# Patient Record
Sex: Female | Born: 1945 | Race: White | Hispanic: No | Marital: Married | Smoking: Former smoker
Health system: Southern US, Community
[De-identification: ages and names within clinical notes are randomized; demographics above are authoritative.]

## PROBLEM LIST (undated history)

## (undated) DIAGNOSIS — M810 Age-related osteoporosis without current pathological fracture: Secondary | ICD-10-CM

## (undated) DIAGNOSIS — I499 Cardiac arrhythmia, unspecified: Secondary | ICD-10-CM

## (undated) DIAGNOSIS — C801 Malignant (primary) neoplasm, unspecified: Secondary | ICD-10-CM

## (undated) DIAGNOSIS — H269 Unspecified cataract: Secondary | ICD-10-CM

## (undated) DIAGNOSIS — H409 Unspecified glaucoma: Secondary | ICD-10-CM

## (undated) DIAGNOSIS — T7840XA Allergy, unspecified, initial encounter: Secondary | ICD-10-CM

## (undated) HISTORY — DX: Cardiac arrhythmia, unspecified: I49.9

## (undated) HISTORY — DX: Malignant (primary) neoplasm, unspecified: C80.1

## (undated) HISTORY — DX: Age-related osteoporosis without current pathological fracture: M81.0

## (undated) HISTORY — DX: Unspecified cataract: H26.9

## (undated) HISTORY — DX: Allergy, unspecified, initial encounter: T78.40XA

## (undated) HISTORY — DX: Unspecified glaucoma: H40.9

---
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
## 1973-12-03 HISTORY — PX: DILATION AND CURETTAGE OF UTERUS: SHX78

## 1973-12-03 HISTORY — PX: APPENDECTOMY: SHX54

## 2005-02-09 ENCOUNTER — Ambulatory Visit: Payer: Self-pay | Admitting: Rheumatology

## 2005-02-22 ENCOUNTER — Ambulatory Visit: Payer: Self-pay | Admitting: Unknown Physician Specialty

## 2005-07-13 LAB — HM COLONOSCOPY: HM COLON: NORMAL

## 2007-12-04 DIAGNOSIS — C801 Malignant (primary) neoplasm, unspecified: Secondary | ICD-10-CM

## 2007-12-04 HISTORY — DX: Malignant (primary) neoplasm, unspecified: C80.1

## 2008-01-13 ENCOUNTER — Ambulatory Visit: Payer: Self-pay | Admitting: Family Medicine

## 2009-05-18 ENCOUNTER — Ambulatory Visit: Payer: Self-pay | Admitting: Family Medicine

## 2010-01-19 ENCOUNTER — Ambulatory Visit: Payer: Self-pay | Admitting: Family Medicine

## 2010-02-15 ENCOUNTER — Ambulatory Visit: Payer: Self-pay | Admitting: Family Medicine

## 2012-04-21 ENCOUNTER — Other Ambulatory Visit: Payer: Self-pay | Admitting: Family Medicine

## 2012-04-21 LAB — CBC WITH DIFFERENTIAL/PLATELET
Basophil #: 0 10*3/uL (ref 0.0–0.1)
Basophil %: 0.7 %
Eosinophil #: 0.3 10*3/uL (ref 0.0–0.7)
HCT: 41.4 % (ref 35.0–47.0)
MCH: 32.2 pg (ref 26.0–34.0)
MCHC: 34.2 g/dL (ref 32.0–36.0)
MCV: 94 fL (ref 80–100)
Monocyte %: 12.4 %
Neutrophil %: 33.9 %
Platelet: 187 10*3/uL (ref 150–440)
RBC: 4.39 10*6/uL (ref 3.80–5.20)
RDW: 12.8 % (ref 11.5–14.5)
WBC: 4.2 10*3/uL (ref 3.6–11.0)

## 2012-05-06 ENCOUNTER — Ambulatory Visit: Payer: Self-pay | Admitting: Family Medicine

## 2012-12-16 ENCOUNTER — Encounter: Payer: Self-pay | Admitting: Internal Medicine

## 2012-12-16 ENCOUNTER — Ambulatory Visit (INDEPENDENT_AMBULATORY_CARE_PROVIDER_SITE_OTHER): Payer: Medicare Other | Admitting: Internal Medicine

## 2012-12-16 VITALS — BP 108/78 | HR 73 | Temp 98.0°F | Resp 16 | Ht 61.25 in | Wt 101.5 lb

## 2012-12-16 DIAGNOSIS — F5104 Psychophysiologic insomnia: Secondary | ICD-10-CM | POA: Insufficient documentation

## 2012-12-16 DIAGNOSIS — G4700 Insomnia, unspecified: Secondary | ICD-10-CM

## 2012-12-16 DIAGNOSIS — E559 Vitamin D deficiency, unspecified: Secondary | ICD-10-CM

## 2012-12-16 DIAGNOSIS — M810 Age-related osteoporosis without current pathological fracture: Secondary | ICD-10-CM

## 2012-12-16 NOTE — Assessment & Plan Note (Signed)
She prefers to handle nonpharmacologically .proper sleep habit discussed.

## 2012-12-16 NOTE — Progress Notes (Addendum)
Patient ID: Nicole Nixon, female   DOB: 1946/03/18, 67 y.o.   MRN: [id number]  There is no problem list on file for this patient.   Subjective:  CC:   Chief Complaint  Patient presents with  . Establish Care    HPI:   Nicole Nixon is a 67 y.o. female who presents as a new patient to establish primary care with the chief complaint oTransferring from Dr. Burnett Sheng.  From [place].  Last PAP smear was May 2013.  no prior abnormals.  Last mammogram [place] in May . Employee of [employer]   Lots of computer work.  Has glaucoma managed by Dr. Alexia Freestone .  Normal colonoscopy age 66. By Renda Rolls.  History of basal call CA skin CA treated by Orson Aloe with cream in 2009, with annual follow up.   Chronic insomnia,  Prefers to manage naturally with relaxation  Averages 6 hours /day .  No hypersomnolence .  Drinks  Coffee in the morning .  Occasional glass of wine. /  Walks daily for exercise and elliptical machine if weather is bad.  No joint pain. 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  Prior treatment with Evista, alendronate.  No change in bone density,  Then Forteo for 11 months did not tolerate the injection due to hypercalcemia. No hotflahses.  History of fibroid tumors.  Resected.  C section done early for placenta previa/abruption.  2 sons live in [place].  5 yr old grandson     Past Medical History  Diagnosis Date  . 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 Glaucoma     Past Surgical History  Procedure Date  . Appendectomy 1975  . Tonsillectomy and adenoidectomy 1958  . Cesarean section 1978  . Dilation and curettage of uterus 1975    Family History  Problem Relation Age of Onset  . Adopted: Yes    History   Social History  . Marital Status: Married    Spouse Name: N/A    Number of Children: N/A  . Years of Education: N/A   Occupational History  . Not on file.   Social History Main Topics  . Smoking status:  Former Smoker    Quit date: 12/16/1981  . Smokeless tobacco: Not on file  . Alcohol Use: No  . Drug Use: No  . Sexually Active: Yes   Other Topics Concern  . Not on file   Social History Narrative  . No narrative on file        Allergies  Allergen Reactions  . Codeine Other (See Comments)    Makes pt very agitated and anxious.   . Erythromycin Other (See Comments)    Sever stomach cramping.    Marland Kitchen Penicillins Rash  . Influenza Virus Vaccine Split     Allergic to eggs,  Needs exemption letter annually      Review of Systems:   The remainder of the review of systems was negative except those addressed in the HPI.       Objective:  BP 108/78  Pulse 73  Temp 98 F (36.7 C) (Oral)  Resp 16  Ht 5' 1.25" (1.556 m)  Wt 101 lb 8 oz (46.04 kg)  BMI 19.02 kg/m2  SpO2 99%  General appearance: alert, cooperative and appears stated age Ears: normal TM's and external ear canals both ears Throat: lips, mucosa, and tongue normal; teeth and gums normal Neck: no adenopathy,  no carotid bruit, supple, symmetrical, trachea midline and thyroid not enlarged, symmetric, no tenderness/mass/nodules Back: symmetric, no curvature. ROM normal. No CVA tenderness. Lungs: clear to auscultation bilaterally Heart: regular rate and rhythm, S1, S2 normal, no murmur, click, rub or gallop Abdomen: soft, non-tender; bowel sounds normal; no masses,  no organomegaly Pulses: 2+ and symmetric Skin: Skin color, texture, turgor normal. No rashes or lesions Lymph nodes: Cervical, supraclavicular, and axillary nodes normal.  Assessment and Plan:  Osteoporosis, post-menopausal By prior DEXA ,with lack of response to prior trials of bisphosphonates , SERMS and PTH.   Vitamin D deficiency She is taking 1000 units daily but has not had her level checked   Chronic insomnia She prefers to handle nonpharmacologically .proper sleep habit discussed.    Updated Medication List Outpatient Encounter  Prescriptions as of 12/16/2012  Medication Sig Dispense Refill  . bimatoprost (LUMIGAN) 0.03 % ophthalmic solution Place 1 drop into both eyes at bedtime.

## 2012-12-16 NOTE — Assessment & Plan Note (Signed)
She is taking 1000 units daily but has not had her level checked

## 2012-12-16 NOTE — Assessment & Plan Note (Signed)
By prior DEXA ,with lack of response to prior trials of bisphosphonates , SERMS and PTH.

## 2012-12-16 NOTE — Patient Instructions (Addendum)
Check with employee health about getting a Pneumonia, a TDaP and Zostavax (shingles vaccine)   We will check a vitamin D level today

## 2013-04-21 ENCOUNTER — Encounter: Payer: Self-pay | Admitting: Internal Medicine

## 2013-04-21 ENCOUNTER — Ambulatory Visit (INDEPENDENT_AMBULATORY_CARE_PROVIDER_SITE_OTHER): Payer: Medicare Other | Admitting: Internal Medicine

## 2013-04-21 VITALS — BP 118/70 | HR 65 | Temp 98.2°F | Resp 16 | Ht 61.0 in | Wt 102.5 lb

## 2013-04-21 DIAGNOSIS — G4700 Insomnia, unspecified: Secondary | ICD-10-CM

## 2013-04-21 DIAGNOSIS — E559 Vitamin D deficiency, unspecified: Secondary | ICD-10-CM

## 2013-04-21 DIAGNOSIS — Z0000 Encounter for general adult medical examination without abnormal findings: Secondary | ICD-10-CM | POA: Insufficient documentation

## 2013-04-21 DIAGNOSIS — Z1239 Encounter for other screening for malignant neoplasm of breast: Secondary | ICD-10-CM

## 2013-04-21 DIAGNOSIS — F5104 Psychophysiologic insomnia: Secondary | ICD-10-CM

## 2013-04-21 DIAGNOSIS — Z23 Encounter for immunization: Secondary | ICD-10-CM

## 2013-04-21 MED ORDER — TETANUS-DIPHTH-ACELL PERTUSSIS 5-2.5-18.5 LF-MCG/0.5 IM SUSP: 0.5000 mL | Freq: Once | INTRAMUSCULAR | Status: DC

## 2013-04-21 MED ORDER — ZOSTER VACCINE LIVE 19400 UNT/0.65ML ~~LOC~~ SOLR: 0.6500 mL | Freq: Once | SUBCUTANEOUS | Status: DC

## 2013-04-21 NOTE — Progress Notes (Signed)
Patient ID: Nicole Nixon, female   DOB: 01-06-1946, 67 y.o.   MRN: [id number]  The patient is here for annual Medicare wellness examination and management of other chronic and acute problems.   The risk factors are reflected in the social history.  The roster of all physicians providing medical care to patient - is listed in the Snapshot section of the chart.  Activities of daily living:  The patient is 100% independent in all ADLs: dressing, toileting, feeding as well as independent mobility  Home safety : The patient has smoke detectors in the home. They wear seatbelts.  There are no firearms at home. There is no violence in the home.   There is no risks for hepatitis, STDs or HIV. There is no   history of blood transfusion. They have no travel history to infectious disease endemic areas of the world.  The patient has seen their dentist in the last six month. They have seen their eye doctor in the last year. They admit to slight hearing difficulty with regard to whispered voices and some television programs.  They have deferred audiologic testing in the last year.  They do not  have excessive sun exposure. Discussed the need for sun protection: hats, long sleeves and use of sunscreen if there is significant sun exposure.   Diet: the importance of a healthy diet is discussed. They do have a healthy diet.  The benefits of regular aerobic exercise were discussed. She walks 4 times per week ,  20 minutes.   Depression screen: there are no signs or vegative symptoms of depression- irritability, change in appetite, anhedonia, sadness/tearfullness.  Cognitive assessment: the patient manages all their financial and personal affairs and is actively engaged. They could relate day,date,year and events; recalled 2/3 objects at 3 minutes; performed clock-face test normally.  The following portions of the patient's history were reviewed and updated as appropriate: allergies, current medications, past  family history, past medical history,  past surgical history, past social history  and problem list.  Visual acuity was not assessed per patient preference since she has regular follow up with her ophthalmologist. Hearing and body mass index were assessed and reviewed.   During the course of the visit the patient was educated and counseled about appropriate screening and preventive services including : fall prevention , diabetes screening, nutrition counseling, colorectal cancer screening, and recommended immunizations.    Objective  General Appearance:    Alert, cooperative, no distress, appears stated age  Head:    Normocephalic, without obvious abnormality, atraumatic  Eyes:    PERRL, conjunctiva/corneas clear, EOM's intact, fundi    benign, both eyes  Ears:    Normal TM's and external ear canals, both ears  Nose:   Nares normal, septum midline, mucosa normal, no drainage    or sinus tenderness  Throat:   Lips, mucosa, and tongue normal; teeth and gums normal  Neck:   Supple, symmetrical, trachea midline, no adenopathy;    thyroid:  no enlargement/tenderness/nodules; no carotid   bruit or JVD  Back:     Symmetric, no curvature, ROM normal, no CVA tenderness  Lungs:     Clear to auscultation bilaterally, respirations unlabored  Chest Wall:    No tenderness or deformity   Heart:    Regular rate and rhythm, S1 and S2 normal, no murmur, rub   or gallop  Breast Exam:    No tenderness, masses, or nipple abnormality  Abdomen:     Soft, non-tender, bowel sounds active  all four quadrants,    no masses, no organomegaly  Genitalia:    Pelvic: cervix normal in appearance, external genitalia normal, no adnexal masses or tenderness, no cervical motion tenderness, rectovaginal septum normal, uterus normal size, shape, and consistency and vagina normal without discharge  Extremities:   Extremities normal, atraumatic, no cyanosis or edema  Pulses:   2+ and symmetric all extremities  Skin:   Skin  color, texture, turgor normal, no rashes or lesions  Lymph nodes:   Cervical, supraclavicular, and axillary nodes normal  Neurologic:   CNII-XII intact, normal strength, sensation and reflexes    throughout   Assessment and Plan  Vitamin D deficiency Normal per January assessment,  continue supplements.   Routine general medical examination at a health care facility Annual comprehensive exam was done including breast, pelvic exam.  . All screenings have been addressed . Recentr [facility] labs show normal GFR, lytes and excellent lipid panel,  HDL 73, LDL 103   Chronic insomnia She wants to avoid prescription meds.  Trial of benadryl offered    Updated Medication List Outpatient Encounter Prescriptions as of 04/21/2013  Medication Sig Dispense Refill  . bimatoprost (LUMIGAN) 0.03 % ophthalmic solution Place 1 drop into both eyes at bedtime.      . TDaP (BOOSTRIX) 5-2.5-18.5 LF-MCG/0.5 injection Inject 0.5 mLs into the muscle once.  0.5 mL  0  . zoster vaccine live, PF, (ZOSTAVAX) 16109 UNT/0.65ML injection Inject 19,400 Units into the skin once.  1 each  0   No facility-administered encounter medications on file as of 04/21/2013.    Subjective:     Nicole Nixon is a 67 y.o. female and is here for a comprehensive physical exam. The patient reports problems - with persistent insomnia.  History   Social History  . Marital Status: Married    Spouse Name: N/A    Number of Children: N/A  . Years of Education: N/A   Occupational History  . Not on file.   Social History Main Topics  . Smoking status: Former Smoker    Quit date: 12/16/1970  . Smokeless tobacco: Not on file  . Alcohol Use: No  . Drug Use: No  . Sexually Active: Yes   Other Topics Concern  . Not on file   Social History Narrative  . No narrative on file   Health Maintenance  Topic Date Due  . Tetanus/tdap  11/18/1966  . Mammogram  11/18/1997  . Colonoscopy  11/18/1997  . Zostavax  11/19/2007  .  Influenza Vaccine  08/03/2013  . Pneumococcal Polysaccharide Vaccine Age 52 And Over  Completed    The following portions of the patient's history were reviewed and updated as appropriate: allergies, current medications, past family history, past medical history, past social history, past surgical history and problem list.  Review of Systems A comprehensive review of systems was negative.   Objective:   BP 118/70  Pulse 65  Temp(Src) 98.2 F (36.8 C) (Oral)  Resp 16  Ht 5\' 1"  (1.549 m)  Wt 102 lb 8 oz (46.494 kg)  BMI 19.38 kg/m2  SpO2 97%  General Appearance:    Alert, cooperative, no distress, appears stated age  Head:    Normocephalic, without obvious abnormality, atraumatic  Eyes:    PERRL, conjunctiva/corneas clear, EOM's intact, fundi    benign, both eyes  Ears:    Normal TM's and external ear canals, both ears  Nose:   Nares normal, septum midline, mucosa normal, no drainage  or sinus tenderness  Throat:   Lips, mucosa, and tongue normal; teeth and gums normal  Neck:   Supple, symmetrical, trachea midline, no adenopathy;    thyroid:  no enlargement/tenderness/nodules; no carotid   bruit or JVD  Back:     Symmetric, no curvature, ROM normal, no CVA tenderness  Lungs:     Clear to auscultation bilaterally, respirations unlabored  Chest Wall:    No tenderness or deformity   Heart:    Regular rate and rhythm, S1 and S2 normal, no murmur, rub   or gallop  Breast Exam:    No tenderness, masses, or nipple abnormality  Abdomen:     Soft, non-tender, bowel sounds active all four quadrants,    no masses, no organomegaly  Genitalia:    Pelvic: cervix normal in appearance, external genitalia normal, no adnexal masses or tenderness, no cervical motion tenderness, rectovaginal septum normal, uterus normal size, shape, and consistency and vagina normal without discharge  Extremities:   Extremities normal, atraumatic, no cyanosis or edema  Pulses:   2+ and symmetric all extremities   Skin:   Skin color, texture, turgor normal, no rashes or lesions  Lymph nodes:   Cervical, supraclavicular, and axillary nodes normal  Neurologic:   CNII-XII intact, normal strength, sensation and reflexes    throughout    Assessment:  .

## 2013-04-21 NOTE — Patient Instructions (Addendum)
Try dipenhydramine (25 mg benadryl) for bedtime insomnia  You can also a hot bath   At night .  Chamomile, or the  "sleepy time " celestial seasons  Flavor (full or relaxing herbs and no caffeine )   For your dog,  The dose is   1 mg per lb   To use for anxiety    You received the Pneumonia vaccine today (because you are 66)  TDap and Zostavax vaccines recommended but will cost you less elsewhere  Mammogram to be ordered at [facility] after June 4  Please complete the take home stool card at your leisure.  We will uses this annually until your next colonoscopy to screen for blood in your stool which may be a sign of colon CA

## 2013-04-22 NOTE — Assessment & Plan Note (Signed)
She wants to avoid prescription meds.  Trial of benadryl offered

## 2013-04-22 NOTE — Assessment & Plan Note (Addendum)
Annual comprehensive exam was done including breast, pelvic exam.  . All screenings have been addressed . Recentr [facility] labs show normal GFR, lytes and excellent lipid panel,  HDL 73, LDL 103

## 2013-04-22 NOTE — Assessment & Plan Note (Addendum)
Normal per January assessment,  continue supplements.

## 2013-05-01 LAB — HM PAP SMEAR: HM PAP: NORMAL

## 2013-06-01 ENCOUNTER — Ambulatory Visit: Payer: Self-pay | Admitting: Internal Medicine

## 2013-06-01 LAB — HM MAMMOGRAPHY

## 2013-06-24 ENCOUNTER — Encounter: Payer: Self-pay | Admitting: Internal Medicine

## 2013-08-17 ENCOUNTER — Telehealth: Payer: Self-pay | Admitting: Internal Medicine

## 2013-08-17 NOTE — Telephone Encounter (Signed)
Can you draft ?

## 2013-08-17 NOTE — Telephone Encounter (Signed)
Patient chart list's allergy to eggs.

## 2013-08-17 NOTE — Telephone Encounter (Signed)
The patient needs a note faxed to [facility] that states she is allergic to eggs and she can't take the flu shot . Fax # [phone].

## 2013-08-18 ENCOUNTER — Encounter: Payer: Self-pay | Admitting: *Deleted

## 2013-08-18 NOTE — Telephone Encounter (Signed)
Faxed letter as requested.

## 2013-08-18 NOTE — Telephone Encounter (Signed)
Letter drafted

## 2013-09-04 ENCOUNTER — Ambulatory Visit (INDEPENDENT_AMBULATORY_CARE_PROVIDER_SITE_OTHER): Payer: 59 | Admitting: Adult Health

## 2013-09-04 ENCOUNTER — Encounter: Payer: Self-pay | Admitting: Adult Health

## 2013-09-04 VITALS — BP 122/70 | HR 70 | Temp 98.5°F | Resp 12 | Wt 102.5 lb

## 2013-09-04 DIAGNOSIS — M25529 Pain in unspecified elbow: Secondary | ICD-10-CM

## 2013-09-04 DIAGNOSIS — M25521 Pain in right elbow: Secondary | ICD-10-CM | POA: Insufficient documentation

## 2013-09-04 NOTE — Patient Instructions (Addendum)
  Apply ice to the area 3-4 times a day for 15 minutes.  You can alternate with heat to promote circulation.  Take an anti-inflammatory such as aleve or ibuprofen regularly for the next week.  I am sending you for an xray to evaluate for any fractures or other bony abnormality.

## 2013-09-04 NOTE — Assessment & Plan Note (Addendum)
Right elbow pain s/p injury x 2. Xray to r/o bony abnormality. NSAIDs for pain x 7 days. Ice to area for 15 min 3-4 times a day. May alternate with heat.

## 2013-09-04 NOTE — Progress Notes (Signed)
  Subjective:    Patient ID: Nicole Nixon, female    DOB: Dec 20, 1945, 67 y.o.   MRN: [id number]  HPI  Pt states she hit her right elbow on a door 3 months ago and then hit it again on the same place a week later. Pt reports continued pain since. Pt reports occasional radiation of pain down lower arm, particularly when lifting something.  Pt denies weakness in arm or hand, denies numbness or tingling. Pt states she has been taking Tylenol when pain becomes severe which helps relieve pain temporarily. Pt reports pain as 3/10 at this time without radiation.    Current Outpatient Prescriptions on File Prior to Visit  Medication Sig Dispense Refill  . bimatoprost (LUMIGAN) 0.03 % ophthalmic solution Place 1 drop into both eyes at bedtime.      . TDaP (BOOSTRIX) 5-2.5-18.5 LF-MCG/0.5 injection Inject 0.5 mLs into the muscle once.  0.5 mL  0  . zoster vaccine live, PF, (ZOSTAVAX) 40981 UNT/0.65ML injection Inject 19,400 Units into the skin once.  1 each  0   No current facility-administered medications on file prior to visit.     Review of Systems  Musculoskeletal: Positive for arthralgias.       Right elbow pain x 3 months.        Objective:   Physical Exam  Constitutional: She is oriented to person, place, and time. She appears well-developed and well-nourished.  Musculoskeletal: Normal range of motion. She exhibits tenderness.       Right elbow: She exhibits normal range of motion. Tenderness found. Lateral epicondyle tenderness noted.  Pain since hitting elbow on door 3 months ago  Neurological: She is alert and oriented to person, place, and time.  Skin: Skin is warm and dry. No ecchymosis noted.  Psychiatric: She has a normal mood and affect. Her behavior is normal. Judgment and thought content normal.   BP 122/70  Pulse 70  Temp(Src) 98.5 F (36.9 C) (Oral)  Resp 12  Wt 102 lb 8 oz (46.494 kg)  BMI 19.38 kg/m2  SpO2 97%        Assessment & Plan:

## 2013-09-07 ENCOUNTER — Ambulatory Visit: Payer: Self-pay | Admitting: Adult Health

## 2013-09-08 ENCOUNTER — Telehealth: Payer: Self-pay | Admitting: Adult Health

## 2013-09-08 NOTE — Telephone Encounter (Signed)
Xray of the elbow was normal. If symptoms do not improve with treatment we discussed then I will refer you to ortho.

## 2013-09-08 NOTE — Telephone Encounter (Signed)
Left voicemail, notifying pt of results and to call back with failure of improvement.

## 2013-09-19 ENCOUNTER — Encounter: Payer: Self-pay | Admitting: Adult Health

## 2014-03-06 ENCOUNTER — Emergency Department: Payer: Self-pay | Admitting: Emergency Medicine

## 2014-03-06 LAB — CBC
HCT: 41.6 % (ref 35.0–47.0)
HGB: 13.7 g/dL (ref 12.0–16.0)
MCH: 30.6 pg (ref 26.0–34.0)
MCHC: 33 g/dL (ref 32.0–36.0)
MCV: 93 fL (ref 80–100)
Platelet: 200 10*3/uL (ref 150–440)
RBC: 4.49 10*6/uL (ref 3.80–5.20)
RDW: 13.4 % (ref 11.5–14.5)
WBC: 6.8 10*3/uL (ref 3.6–11.0)

## 2014-03-06 LAB — COMPREHENSIVE METABOLIC PANEL
ALK PHOS: 84 U/L
ALT: 19 U/L (ref 12–78)
Albumin: 3.5 g/dL (ref 3.4–5.0)
Anion Gap: 6 — ABNORMAL LOW (ref 7–16)
BUN: 21 mg/dL — ABNORMAL HIGH (ref 7–18)
Bilirubin,Total: 0.3 mg/dL (ref 0.2–1.0)
CO2: 27 mmol/L (ref 21–32)
Calcium, Total: 8.6 mg/dL (ref 8.5–10.1)
Chloride: 107 mmol/L (ref 98–107)
Creatinine: 1.08 mg/dL (ref 0.60–1.30)
EGFR (African American): >60
EGFR (Non-African Amer.): 53 — ABNORMAL LOW
GLUCOSE: 136 mg/dL — AB (ref 65–99)
OSMOLALITY: 284 (ref 275–301)
Potassium: 4.3 mmol/L (ref 3.5–5.1)
SGOT(AST): 24 U/L (ref 15–37)
Sodium: 140 mmol/L (ref 136–145)
Total Protein: 7.4 g/dL (ref 6.4–8.2)

## 2014-03-06 LAB — URINALYSIS, COMPLETE
BILIRUBIN, UR: NEGATIVE
Bacteria: NONE SEEN
Glucose,UR: NEGATIVE mg/dL (ref 0–75)
Ketone: NEGATIVE
LEUKOCYTE ESTERASE: NEGATIVE
Nitrite: NEGATIVE
PH: 5 (ref 4.5–8.0)
PROTEIN: NEGATIVE
RBC,UR: 96 /HPF (ref 0–5)
Specific Gravity: 1.025 (ref 1.003–1.030)
Squamous Epithelial: <1
WBC UR: 5 /HPF (ref 0–5)

## 2014-03-19 ENCOUNTER — Telehealth: Payer: Self-pay | Admitting: Internal Medicine

## 2014-03-19 ENCOUNTER — Encounter: Payer: Self-pay | Admitting: Internal Medicine

## 2014-03-19 ENCOUNTER — Ambulatory Visit (INDEPENDENT_AMBULATORY_CARE_PROVIDER_SITE_OTHER): Payer: BC Managed Care – PPO | Admitting: Internal Medicine

## 2014-03-19 VITALS — BP 98/60 | HR 73 | Temp 98.5°F | Resp 16 | Wt 102.5 lb

## 2014-03-19 DIAGNOSIS — J069 Acute upper respiratory infection, unspecified: Secondary | ICD-10-CM

## 2014-03-19 MED ORDER — AZITHROMYCIN 500 MG PO TABS: 500.0000 mg | ORAL_TABLET | Freq: Every day | ORAL | Status: DC

## 2014-03-19 MED ORDER — PREDNISONE (PAK) 10 MG PO TABS: ORAL_TABLET | ORAL | Status: DC

## 2014-03-19 MED ORDER — CULTURELLE DIGESTIVE HEALTH PO CAPS: 1.0000 | ORAL_CAPSULE | Freq: Every day | ORAL | Status: DC

## 2014-03-19 NOTE — Telephone Encounter (Signed)
The patient is having cough and congestion and would like a z pack called into the pharmacy.

## 2014-03-19 NOTE — Progress Notes (Signed)
Patient ID: Nicole Nixon, female   DOB: 11-12-1946, 68 y.o.   MRN: [id number]   Patient Active Problem List   Diagnosis Date Noted  . Acute URI 03/21/2014  . Right elbow pain 09/04/2013  . Routine general medical examination at a health care facility 04/21/2013  . Osteoporosis, post-menopausal 12/16/2012  . Vitamin D deficiency 12/16/2012  . Chronic insomnia 12/16/2012    Subjective:  CC:   Chief Complaint  Patient presents with  . Acute Visit    Cough and chest congestion    HPI:   Nicole Nixon is a 68 y.o. female who presents for 2 week history of cough.  Symptoms began after being treated in ER for kidney stone .    No fevers,  Myalgias or chest pain, Cough is minimally productive.   No sneezing.  Coughing spells  Still occasionally paroxysmal,  Using [place] to control it.  No wheezing or shortness of breath.     Past Medical History  Diagnosis Date  . 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 Glaucoma     Past Surgical History  Procedure Laterality Date  . Appendectomy  1975  . Tonsillectomy and adenoidectomy  1958  . Cesarean section  1978  . Dilation and curettage of uterus  1975       The following portions of the patient's history were reviewed and updated as appropriate: Allergies, current medications, and problem list.    Review of Systems:   Patient denies headache, fevers, malaise, unintentional weight loss, skin rash, eye pain, sinus congestion and sinus pain, sore throat, dysphagia,  hemoptysis , cough, dyspnea, wheezing, chest pain, palpitations, orthopnea, edema, abdominal pain, nausea, melena, diarrhea, constipation, flank pain, dysuria, hematuria, urinary  Frequency, nocturia, numbness, tingling, seizures,  Focal weakness, Loss of consciousness,  Tremor, insomnia, depression, anxiety, and suicidal ideation.     History   Social History  . Marital Status: Married    Spouse Name: N/A    Number of Children: N/A  . Years of  Education: N/A   Occupational History  . Not on file.   Social History Main Topics  . Smoking status: Former Smoker    Quit date: 12/16/1970  . Smokeless tobacco: Not on file  . Alcohol Use: No  . Drug Use: No  . Sexual Activity: Yes   Other Topics Concern  . Not on file   Social History Narrative  . No narrative on file    Objective:  Filed Vitals:   03/19/14 1429  BP: 98/60  Pulse: 73  Temp: 98.5 F (36.9 C)  Resp: 16     General appearance: alert, cooperative and appears stated age Ears: normal TM's and external ear canals both ears Throat: lips, mucosa, and tongue normal; teeth and gums normal Neck: no adenopathy, no carotid bruit, supple, symmetrical, trachea midline and thyroid not enlarged, symmetric, no tenderness/mass/nodules Back: symmetric, no curvature. ROM normal. No CVA tenderness. Lungs: clear to auscultation bilaterally Heart: regular rate and rhythm, S1, S2 normal, no murmur, click, rub or gallop Abdomen: soft, non-tender; bowel sounds normal; no masses,  no organomegaly Pulses: 2+ and symmetric Skin: Skin color, texture, turgor normal. No rashes or lesions Lymph nodes: Cervical, supraclavicular, and axillary nodes normal.  Assessment and Plan:  Acute URI Will treat supportively for bronchitis without antibiotics .     Updated Medication List Outpatient Encounter Prescriptions as of 03/19/2014  Medication Sig  . azithromycin (ZITHROMAX) 500 MG tablet Take 1 tablet (  500 mg total) by mouth daily.  . bimatoprost (LUMIGAN) 0.03 % ophthalmic solution Place 1 drop into both eyes at bedtime.  . Lactobacillus-Inulin (Erwin) CAPS Take 1 capsule by mouth daily.  . predniSONE (STERAPRED UNI-PAK) 10 MG tablet 6 tablets on Day 1 , then reduce by 1 tablet daily until gone  . TDaP (BOOSTRIX) 5-2.5-18.5 LF-MCG/0.5 injection Inject 0.5 mLs into the muscle once.  . zoster vaccine live, PF, (ZOSTAVAX) 45859 UNT/0.65ML injection Inject  19,400 Units into the skin once.     No orders of the defined types were placed in this encounter.    No Follow-up on file.

## 2014-03-19 NOTE — Telephone Encounter (Signed)
Patient aware and will be here at 2.30

## 2014-03-19 NOTE — Telephone Encounter (Signed)
Cough and congestion times 2 weeks, no fever, Yellow mucus, please advise.

## 2014-03-19 NOTE — Telephone Encounter (Signed)
I do not call in antibiotics for sinus symptoms.  Patient will need to be seen,  She can be worked in this afternoon

## 2014-03-19 NOTE — Patient Instructions (Addendum)
You have a viral syndrome which is causing bronchitis.    The post nasal drip may alsoe be contributing to  your  Cough.  I am prescribing a prednisone taper to manage the inflammation in your bronchial tubes   I also advise use of the following OTC meds to help with your other symptoms.   Take generic OTC benadryl 25 mg  At bedtime for the drainage,,  Delsym for daytime cough. flush your sinuses twice daily with Simply Saline   If the coughing has not impoved in  48 hours  OR  if you develop T > 100.4,  Green nasal discharge,  Or facial pain, start the antibiotic.  Please take a probiotic ( Align, Floraque or Culturelle) while you are on the antibiotic to prevent a serious antibiotic associated diarrhea  Called clostridium dificile colitis and a vaginal yeast infection

## 2014-03-19 NOTE — Progress Notes (Signed)
Pre-visit discussion using our clinic review tool. No additional management support is needed unless otherwise documented below in the visit note.  

## 2014-03-21 DIAGNOSIS — J069 Acute upper respiratory infection, unspecified: Secondary | ICD-10-CM | POA: Insufficient documentation

## 2014-03-21 NOTE — Assessment & Plan Note (Signed)
Will treat supportively for bronchitis without antibiotics .

## 2014-06-01 ENCOUNTER — Ambulatory Visit (INDEPENDENT_AMBULATORY_CARE_PROVIDER_SITE_OTHER): Payer: BC Managed Care – PPO | Admitting: Adult Health

## 2014-06-01 ENCOUNTER — Encounter: Payer: Self-pay | Admitting: Adult Health

## 2014-06-01 VITALS — BP 124/82 | HR 81 | Temp 98.1°F | Resp 14 | Ht 61.0 in | Wt 101.5 lb

## 2014-06-01 DIAGNOSIS — J019 Acute sinusitis, unspecified: Secondary | ICD-10-CM

## 2014-06-01 MED ORDER — AZITHROMYCIN 250 MG PO TABS: ORAL_TABLET | ORAL | Status: DC

## 2014-06-01 NOTE — Progress Notes (Signed)
   Subjective:    Patient ID: Nicole Nixon, female    DOB: 1946/11/18, 68 y.o.   MRN: [id number]  HPI  Pleasant 68 yo female presents today with signs of congestion. Initially started about 11 days ago. Indicates she may have had a fever but she did not take her temperature. States that she felt achy and hot since and the congestion has continued. Wants to stop this before it gets into her chest. She has been taking Allegra-D and Roc and rye which has afforded some relief to this point.   Past Medical History  Diagnosis Date  . 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 Glaucoma     Current Outpatient Prescriptions on File Prior to Visit  Medication Sig Dispense Refill  . bimatoprost (LUMIGAN) 0.03 % ophthalmic solution Place 1 drop into both eyes at bedtime.      . TDaP (BOOSTRIX) 5-2.5-18.5 LF-MCG/0.5 injection Inject 0.5 mLs into the muscle once.  0.5 mL  0  . zoster vaccine live, PF, (ZOSTAVAX) 84665 UNT/0.65ML injection Inject 19,400 Units into the skin once.  1 each  0   No current facility-administered medications on file prior to visit.     Review of Systems  Constitutional: Positive for fever.  HENT: Positive for postnasal drip and sinus pressure.   Respiratory: Positive for cough. Negative for shortness of breath.   All other systems reviewed and are negative.      Objective:  BP 124/82  Pulse 81  Temp(Src) 98.1 F (36.7 C) (Oral)  Resp 14  Wt 101 lb 8 oz (46.04 kg)  SpO2 97%   Physical Exam  Constitutional: She is oriented to person, place, and time. She appears well-developed and well-nourished. No distress.  HENT:  Head: Normocephalic.  Right Ear: External ear normal.  Left Ear: External ear normal.  Mouth/Throat: No oropharyngeal exudate.  Post nasal drip; cervical lymphadenopathy noted  Neck: Neck supple.  Cardiovascular: Normal rate, regular rhythm and normal heart sounds.   Pulmonary/Chest: Effort normal and breath sounds normal.    Neurological: She is alert and oriented to person, place, and time.  Skin: Skin is warm and dry.  Psychiatric: She has a normal mood and affect. Her behavior is normal. Judgment and thought content normal.       Assessment & Plan:  1. Acute sinusitis, unspecified Symptoms consistent with bacterial sinusitis. Start azithromycin as described below. Continue using Allegra-D. Use saline nasal sprays, sleep with humidifier and drink plenty of fluids. Return to office if no improvement in 3-4 days.  - azithromycin (ZITHROMAX) 250 MG tablet; Start 2 tablets today then 1 tablet daily for 4 days.  Dispense: 6 tablet; Refill: 0

## 2014-06-01 NOTE — Patient Instructions (Signed)
  Start azithromycin 2 tablets today then one tablet daily for the next 4 days.  Drink plenty of fluids to maintain hydration.  You may continue Allegra-D if symptoms are improved.  Call if no improvement within 3-4 days or sooner if necessary.

## 2014-06-01 NOTE — Progress Notes (Signed)
Pre visit review using our clinic review tool, if applicable. No additional management support is needed unless otherwise documented below in the visit note. 

## 2014-06-30 ENCOUNTER — Telehealth: Payer: Self-pay | Admitting: Internal Medicine

## 2014-06-30 DIAGNOSIS — E785 Hyperlipidemia, unspecified: Secondary | ICD-10-CM

## 2014-06-30 DIAGNOSIS — R5381 Other malaise: Secondary | ICD-10-CM

## 2014-06-30 DIAGNOSIS — M810 Age-related osteoporosis without current pathological fracture: Secondary | ICD-10-CM

## 2014-06-30 DIAGNOSIS — R5383 Other fatigue: Secondary | ICD-10-CM

## 2014-06-30 NOTE — Telephone Encounter (Signed)
Pt wanted to know if she needed fasting lab prior to having her wellness visit on 8/11.msn

## 2014-06-30 NOTE — Telephone Encounter (Signed)
Last fasting scanned in 5/14

## 2014-06-30 NOTE — Telephone Encounter (Signed)
Yes, fasting labs have been ordered.

## 2014-06-30 NOTE — Telephone Encounter (Signed)
Patient notified and lab appointment scheduled. 

## 2014-07-08 ENCOUNTER — Other Ambulatory Visit (INDEPENDENT_AMBULATORY_CARE_PROVIDER_SITE_OTHER): Payer: Medicare Other

## 2014-07-08 DIAGNOSIS — R5381 Other malaise: Secondary | ICD-10-CM

## 2014-07-08 DIAGNOSIS — E785 Hyperlipidemia, unspecified: Secondary | ICD-10-CM

## 2014-07-08 DIAGNOSIS — M810 Age-related osteoporosis without current pathological fracture: Secondary | ICD-10-CM

## 2014-07-08 DIAGNOSIS — R5383 Other fatigue: Secondary | ICD-10-CM

## 2014-07-08 LAB — CBC WITH DIFFERENTIAL/PLATELET
BASOS PCT: 0.5 % (ref 0.0–3.0)
Basophils Absolute: 0 10*3/uL (ref 0.0–0.1)
EOS ABS: 0.2 10*3/uL (ref 0.0–0.7)
EOS PCT: 3.9 % (ref 0.0–5.0)
HEMATOCRIT: 41.7 % (ref 36.0–46.0)
Hemoglobin: 13.8 g/dL (ref 12.0–15.0)
LYMPHS ABS: 1.8 10*3/uL (ref 0.7–4.0)
Lymphocytes Relative: 43.3 % (ref 12.0–46.0)
MCHC: 33.2 g/dL (ref 30.0–36.0)
MCV: 93 fl (ref 78.0–100.0)
MONO ABS: 0.6 10*3/uL (ref 0.1–1.0)
Monocytes Relative: 13.7 % — ABNORMAL HIGH (ref 3.0–12.0)
Neutro Abs: 1.6 10*3/uL (ref 1.4–7.7)
Neutrophils Relative %: 38.6 % — ABNORMAL LOW (ref 43.0–77.0)
Platelets: 244 10*3/uL (ref 150.0–400.0)
RBC: 4.48 Mil/uL (ref 3.87–5.11)
RDW: 13.3 % (ref 11.5–15.5)
WBC: 4.2 10*3/uL (ref 4.0–10.5)

## 2014-07-08 LAB — COMPREHENSIVE METABOLIC PANEL
ALK PHOS: 76 U/L (ref 39–117)
ALT: 18 U/L (ref 0–35)
AST: 19 U/L (ref 0–37)
Albumin: 3.8 g/dL (ref 3.5–5.2)
BILIRUBIN TOTAL: 0.7 mg/dL (ref 0.2–1.2)
BUN: 19 mg/dL (ref 6–23)
CO2: 29 mEq/L (ref 19–32)
CREATININE: 0.8 mg/dL (ref 0.4–1.2)
Calcium: 9.2 mg/dL (ref 8.4–10.5)
Chloride: 101 mEq/L (ref 96–112)
GFR: 79.56 mL/min (ref >60.00)
GLUCOSE: 86 mg/dL (ref 70–99)
Potassium: 4.4 mEq/L (ref 3.5–5.1)
Sodium: 138 mEq/L (ref 135–145)
Total Protein: 6.6 g/dL (ref 6.0–8.3)

## 2014-07-08 LAB — LIPID PANEL
CHOLESTEROL: 187 mg/dL (ref 0–200)
HDL: 51.7 mg/dL (ref >39.00)
LDL Cholesterol: 120 mg/dL — ABNORMAL HIGH (ref 0–99)
NonHDL: 135.3
TRIGLYCERIDES: 79 mg/dL (ref 0.0–149.0)
Total CHOL/HDL Ratio: 4
VLDL: 15.8 mg/dL (ref 0.0–40.0)

## 2014-07-08 LAB — TSH: TSH: 1.78 u[IU]/mL (ref 0.35–4.50)

## 2014-07-08 LAB — VITAMIN D 25 HYDROXY (VIT D DEFICIENCY, FRACTURES): VITD: 48.54 ng/mL (ref 30.00–100.00)

## 2014-07-12 ENCOUNTER — Encounter: Payer: Self-pay | Admitting: Internal Medicine

## 2014-07-13 ENCOUNTER — Encounter: Payer: Self-pay | Admitting: Internal Medicine

## 2014-07-13 ENCOUNTER — Ambulatory Visit (INDEPENDENT_AMBULATORY_CARE_PROVIDER_SITE_OTHER): Payer: Medicare Other | Admitting: Internal Medicine

## 2014-07-13 VITALS — BP 108/74 | HR 69 | Temp 98.1°F | Resp 16 | Ht 61.0 in | Wt 98.5 lb

## 2014-07-13 DIAGNOSIS — K589 Irritable bowel syndrome without diarrhea: Secondary | ICD-10-CM

## 2014-07-13 DIAGNOSIS — Z1239 Encounter for other screening for malignant neoplasm of breast: Secondary | ICD-10-CM

## 2014-07-13 DIAGNOSIS — Z0000 Encounter for general adult medical examination without abnormal findings: Secondary | ICD-10-CM

## 2014-07-13 DIAGNOSIS — M810 Age-related osteoporosis without current pathological fracture: Secondary | ICD-10-CM

## 2014-07-13 MED ORDER — DICYCLOMINE HCL 20 MG PO TABS: 20.0000 mg | ORAL_TABLET | Freq: Four times a day (QID) | ORAL | Status: DC

## 2014-07-13 MED ORDER — ZOSTER VACCINE LIVE 19400 UNT/0.65ML ~~LOC~~ SOLR: 0.6500 mL | Freq: Once | SUBCUTANEOUS | Status: DC

## 2014-07-13 NOTE — Patient Instructions (Addendum)
You had your annual Medicare wellness exam today  We will schedule your mammogram soon.  You  Are advised to have a  Shingles vaccine.  I have given you prescriptions for this because it will be cheaper at your  local pharmacy because Medicare will not reimburse for them.   I have sent a prescription for dicyclomine to your pharmacy to take before meals to stop your cramping     Irritable Bowel Syndrome Irritable bowel syndrome (IBS) is caused by a disturbance of normal bowel function and is a common digestive disorder. You may also hear this condition called spastic colon, mucous colitis, and irritable colon. There is no cure for IBS. However, symptoms often gradually improve or disappear with a good diet, stress management, and medicine. This condition usually appears in late adolescence or early adulthood. Women develop it twice as often as men. CAUSES  After food has been digested and absorbed in the small intestine, waste material is moved into the large intestine, or colon. In the colon, water and salts are absorbed from the undigested products coming from the small intestine. The remaining residue, or fecal material, is held for elimination. Under normal circumstances, gentle, rhythmic contractions of the bowel walls push the fecal material along the colon toward the rectum. In IBS, however, these contractions are irregular and poorly coordinated. The fecal material is either retained too long, resulting in constipation, or expelled too soon, producing diarrhea. SIGNS AND SYMPTOMS  The most common symptom of IBS is abdominal pain. It is often in the lower left side of the abdomen, but it may occur anywhere in the abdomen. The pain comes from spasms of the bowel muscles happening too much and from the buildup of gas and fecal material in the colon. This pain:  Can range from sharp abdominal cramps to a dull, continuous ache.  Often worsens soon after eating.  Is often relieved by having  a bowel movement or passing gas. Abdominal pain is usually accompanied by constipation, but it may also produce diarrhea. The diarrhea often occurs right after a meal or upon waking up in the morning. The stools are often soft, watery, and flecked with mucus. Other symptoms of IBS include:  Bloating.  Loss of appetite.  Heartburn.  Backache.  Dull pain in the arms or shoulders.  Nausea.  Burping.  Vomiting.  Gas. IBS may also cause symptoms that are unrelated to the digestive system, such as:  Fatigue.  Headaches.  Anxiety.  Shortness of breath.  Trouble concentrating.  Dizziness. These symptoms tend to come and go. DIAGNOSIS  The symptoms of IBS may seem like symptoms of other, more serious digestive disorders. Your health care provider may want to perform tests to exclude these disorders.  TREATMENT Many medicines are available to help correct bowel function or relieve bowel spasms and abdominal pain. Among the medicines available are:  Laxatives for severe constipation and to help restore normal bowel habits.  Specific antidiarrheal medicines to treat severe or lasting diarrhea.  Antispasmodic agents to relieve intestinal cramps. Your health care provider may also decide to treat you with a mild tranquilizer or sedative during unusually stressful periods in your life. Your health care provider may also prescribe antidepressant medicine. The use of this medicine has been shown to reduce pain and other symptoms of IBS. Remember that if any medicine is prescribed for you, you should take it exactly as directed. Make sure your health care provider knows how well it worked for you. HOME  CARE INSTRUCTIONS   Take all medicines as directed by your health care provider.  Avoid foods that are high in fat or oils, such as heavy cream, butter, frankfurters, sausage, and other fatty meats.  Avoid foods that make you go to the bathroom, such as fruit, fruit juice, and dairy  products.  Cut out carbonated drinks, chewing gum, and "gassy" foods such as beans and cabbage. This may help relieve bloating and burping.  Eat foods with bran, and drink plenty of liquids with the bran foods. This helps relieve constipation.  Keep track of what foods seem to bring on your symptoms.  Avoid emotionally charged situations or circumstances that produce anxiety.  Start or continue exercising.  Get plenty of rest and sleep. Document Released: 11/19/2005 Document Revised: 11/24/2013 Document Reviewed: 07/09/2008 [facility] Patient Information 2015 [place], [place]. This information is not intended to replace advice given to you by your health care provider. Make sure you discuss any questions you have with your health care provider.  Health Maintenance Adopting a healthy lifestyle and getting preventive care can go a long way to promote health and wellness. Talk with your health care provider about what schedule of regular examinations is right for you. This is a good chance for you to check in with your provider about disease prevention and staying healthy. In between checkups, there are plenty of things you can do on your own. Experts have done a lot of research about which lifestyle changes and preventive measures are most likely to keep you healthy. Ask your health care provider for more information. WEIGHT AND DIET  Eat a healthy diet  Be sure to include plenty of vegetables, fruits, low-fat dairy products, and lean protein.  Do not eat a lot of foods high in solid fats, added sugars, or salt.  Get regular exercise. This is one of the most important things you can do for your health.  Most adults should exercise for at least 150 minutes each week. The exercise should increase your heart rate and make you sweat (moderate-intensity exercise).  Most adults should also do strengthening exercises at least twice a week. This is in addition to the moderate-intensity exercise.   Maintain a healthy weight  Body mass index (BMI) is a measurement that can be used to identify possible weight problems. It estimates body fat based on height and weight. Your health care provider can help determine your BMI and help you achieve or maintain a healthy weight.  For females 87 years of age and older:   A BMI below 18.5 is considered underweight.  A BMI of 18.5 to 24.9 is normal.  A BMI of 25 to 29.9 is considered overweight.  A BMI of 30 and above is considered obese.  Watch levels of cholesterol and blood lipids  You should start having your blood tested for lipids and cholesterol at 67 years of age, then have this test every 5 years.  You may need to have your cholesterol levels checked more often if:  Your lipid or cholesterol levels are high.  You are older than 67 years of age.  You are at high risk for heart disease.  CANCER SCREENING   Lung Cancer  Lung cancer screening is recommended for adults 63-13 years old who are at high risk for lung cancer because of a history of smoking.  A yearly low-dose CT scan of the lungs is recommended for people who:  Currently smoke.  Have quit within the past 15 years.  Have at least a 30-pack-year history of smoking. A pack year is smoking an average of one pack of cigarettes a day for 1 year.  Yearly screening should continue until it has been 15 years since you quit.  Yearly screening should stop if you develop a health problem that would prevent you from having lung cancer treatment.  Breast Cancer  Practice breast self-awareness. This means understanding how your breasts normally appear and feel.  It also means doing regular breast self-exams. Let your health care provider know about any changes, no matter how small.  If you are in your 20s or 30s, you should have a clinical breast exam (CBE) by a health care provider every 1-3 years as part of a regular health exam.  If you are 55 or older, have a  CBE every year. Also consider having a breast X-ray (mammogram) every year.  If you have a family history of breast cancer, talk to your health care provider about genetic screening.  If you are at high risk for breast cancer, talk to your health care provider about having an MRI and a mammogram every year.  Breast cancer gene (BRCA) assessment is recommended for women who have family members with BRCA-related cancers. BRCA-related cancers include:  Breast.  Ovarian.  Tubal.  Peritoneal cancers.  Results of the assessment will determine the need for genetic counseling and BRCA1 and BRCA2 testing. Cervical Cancer Routine pelvic examinations to screen for cervical cancer are no longer recommended for nonpregnant women who are considered low risk for cancer of the pelvic organs (ovaries, uterus, and vagina) and who do not have symptoms. A pelvic examination may be necessary if you have symptoms including those associated with pelvic infections. Ask your health care provider if a screening pelvic exam is right for you.   The Pap test is the screening test for cervical cancer for women who are considered at risk.  If you had a hysterectomy for a problem that was not cancer or a condition that could lead to cancer, then you no longer need Pap tests.  If you are older than 65 years, and you have had normal Pap tests for the past 10 years, you no longer need to have Pap tests.  If you have had past treatment for cervical cancer or a condition that could lead to cancer, you need Pap tests and screening for cancer for at least 20 years after your treatment.  If you no longer get a Pap test, assess your risk factors if they change (such as having a new sexual partner). This can affect whether you should start being screened again.  Some women have medical problems that increase their chance of getting cervical cancer. If this is the case for you, your health care provider may recommend more  frequent screening and Pap tests.  The human papillomavirus (HPV) test is another test that may be used for cervical cancer screening. The HPV test looks for the virus that can cause cell changes in the cervix. The cells collected during the Pap test can be tested for HPV.  The HPV test can be used to screen women 67 years of age and older. Getting tested for HPV can extend the interval between normal Pap tests from three to five years.  An HPV test also should be used to screen women of any age who have unclear Pap test results.  After 67 years of age, women should have HPV testing as often as Pap tests.  Colorectal  Cancer  This type of cancer can be detected and often prevented.  Routine colorectal cancer screening usually begins at 67 years of age and continues through 67 years of age.  Your health care provider may recommend screening at an earlier age if you have risk factors for colon cancer.  Your health care provider may also recommend using home test kits to check for hidden blood in the stool.  A small camera at the end of a tube can be used to examine your colon directly (sigmoidoscopy or colonoscopy). This is done to check for the earliest forms of colorectal cancer.  Routine screening usually begins at age 31.  Direct examination of the colon should be repeated every 5-10 years through 67 years of age. However, you may need to be screened more often if early forms of precancerous polyps or small growths are found. Skin Cancer  Check your skin from head to toe regularly.  Tell your health care provider about any new moles or changes in moles, especially if there is a change in a mole's shape or color.  Also tell your health care provider if you have a mole that is larger than the size of a pencil eraser.  Always use sunscreen. Apply sunscreen liberally and repeatedly throughout the day.  Protect yourself by wearing long sleeves, pants, a wide-brimmed hat, and sunglasses  whenever you are outside. HEART DISEASE, DIABETES, AND HIGH BLOOD PRESSURE   Have your blood pressure checked at least every 1-2 years. High blood pressure causes heart disease and increases the risk of stroke.  If you are between 65 years and 41 years old, ask your health care provider if you should take aspirin to prevent strokes.  Have regular diabetes screenings. This involves taking a blood sample to check your fasting blood sugar level.  If you are at a normal weight and have a low risk for diabetes, have this test once every three years after 67 years of age.  If you are overweight and have a high risk for diabetes, consider being tested at a younger age or more often. PREVENTING INFECTION  Hepatitis B  If you have a higher risk for hepatitis B, you should be screened for this virus. You are considered at high risk for hepatitis B if:  You were born in a country where hepatitis B is common. Ask your health care provider which countries are considered high risk.  Your parents were born in a high-risk country, and you have not been immunized against hepatitis B (hepatitis B vaccine).  You have HIV or AIDS.  You use needles to inject street drugs.  You live with someone who has hepatitis B.  You have had sex with someone who has hepatitis B.  You get hemodialysis treatment.  You take certain medicines for conditions, including cancer, organ transplantation, and autoimmune conditions. Hepatitis C  Blood testing is recommended for:  Everyone born from [birth year] through 1964.  Anyone with known risk factors for hepatitis C. Sexually transmitted infections (STIs)  You should be screened for sexually transmitted infections (STIs) including gonorrhea and chlamydia if:  You are sexually active and are younger than 67 years of age.  You are older than 67 years of age and your health care provider tells you that you are at risk for this type of infection.  Your sexual activity  has changed since you were last screened and you are at an increased risk for chlamydia or gonorrhea. Ask your health care provider if  you are at risk.  If you do not have HIV, but are at risk, it may be recommended that you take a prescription medicine daily to prevent HIV infection. This is called pre-exposure prophylaxis (PrEP). You are considered at risk if:  You are sexually active and do not regularly use condoms or know the HIV status of your partner(s).  You take drugs by injection.  You are sexually active with a partner who has HIV. Talk with your health care provider about whether you are at high risk of being infected with HIV. If you choose to begin PrEP, you should first be tested for HIV. You should then be tested every 3 months for as long as you are taking PrEP.  PREGNANCY   If you are premenopausal and you may become pregnant, ask your health care provider about preconception counseling.  If you may become pregnant, take 400 to 800 micrograms (mcg) of folic acid every day.  If you want to prevent pregnancy, talk to your health care provider about birth control (contraception). OSTEOPOROSIS AND MENOPAUSE   Osteoporosis is a disease in which the bones lose minerals and strength with aging. This can result in serious bone fractures. Your risk for osteoporosis can be identified using a bone density scan.  If you are 52 years of age or older, or if you are at risk for osteoporosis and fractures, ask your health care provider if you should be screened.  Ask your health care provider whether you should take a calcium or vitamin D supplement to lower your risk for osteoporosis.  Menopause may have certain physical symptoms and risks.  Hormone replacement therapy may reduce some of these symptoms and risks. Talk to your health care provider about whether hormone replacement therapy is right for you.  HOME CARE INSTRUCTIONS   Schedule regular health, dental, and eye  exams.  Stay current with your immunizations.   Do not use any tobacco products including cigarettes, chewing tobacco, or electronic cigarettes.  If you are pregnant, do not drink alcohol.  If you are breastfeeding, limit how much and how often you drink alcohol.  Limit alcohol intake to no more than 1 drink per day for nonpregnant women. One drink equals 12 ounces of beer, 5 ounces of wine, or 1 ounces of hard liquor.  Do not use street drugs.  Do not share needles.  Ask your health care provider for help if you need support or information about quitting drugs.  Tell your health care provider if you often feel depressed.  Tell your health care provider if you have ever been abused or do not feel safe at home. Document Released: 06/04/2011 Document Revised: 04/05/2014 Document Reviewed: 10/21/2013 [facility] Patient Information 2015 [place], [place]. This information is not intended to replace advice given to you by your health care provider. Make sure you discuss any questions you have with your health care provider.

## 2014-07-13 NOTE — Assessment & Plan Note (Signed)
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   Multiple food intolerances oddered trail fo bentyl,  And advised to try Beano with all meals containing vegetables

## 2014-07-13 NOTE — Progress Notes (Signed)
Patient ID: Nicole Nixon, female   DOB: 08-19-1946, 68 y.o.   MRN: [id number]   The patient is here for annual Medicare wellness examination and management of other chronic and acute problems.   The risk factors are reflected in the social history.  The roster of all physicians providing medical care to patient - is listed in the Snapshot section of the chart.  Activities of daily living:  The patient is 100% independent in all ADLs: dressing, toileting, feeding as well as independent mobility  Home safety : The patient has smoke detectors in the home. They wear seatbelts.  There are no firearms at home. There is no violence in the home.   There is no risks for hepatitis, STDs or HIV. There is no   history of blood transfusion. They have no travel history to infectious disease endemic areas of the world.  The patient has seen their dentist in the last six month. They have seen their eye doctor in the last year. They admit to slight hearing difficulty with regard to whispered voices and some television programs.  They have deferred audiologic testing in the last year.  They do not  have excessive sun exposure. Discussed the need for sun protection: hats, long sleeves and use of sunscreen if there is significant sun exposure.   Diet: the importance of a healthy diet is discussed. They do have a healthy diet.  The benefits of regular aerobic exercise were discussed. She walks 4 times per week ,  20 minutes.   Depression screen: there are no signs or vegative symptoms of depression- irritability, change in appetite, anhedonia, sadness/tearfullness.  Cognitive assessment: the patient manages all their financial and personal affairs and is actively engaged. They could relate day,date,year and events; recalled 2/3 objects at 3 minutes; performed clock-face test normally.  The following portions of the patient's history were reviewed and updated as appropriate: allergies, current medications,  past family history, past medical history,  past surgical history, past social history  and problem list.  Visual acuity was not assessed per patient preference since she has regular follow up with her ophthalmologist. Hearing and body mass index were assessed and reviewed.   During the course of the visit the patient was educated and counseled about appropriate screening and preventive services including : fall prevention , diabetes screening, nutrition counseling, colorectal cancer screening, and recommended immunizations.    Objective:  BP 108/74  Pulse 69  Temp(Src) 98.1 F (36.7 C) (Oral)  Resp 16  Ht 5\' 1"  (1.549 m)  Wt 98 lb 8 oz (44.679 kg)  BMI 18.62 kg/m2  SpO2 94%  General appearance: alert, cooperative and appears stated age Head: Normocephalic, without obvious abnormality, atraumatic Eyes: conjunctivae/corneas clear. PERRL, EOM's intact. Fundi benign. Ears: normal TM's and external ear canals both ears Nose: Nares normal. Septum midline. Mucosa normal. No drainage or sinus tenderness. Throat: lips, mucosa, and tongue normal; teeth and gums normal Neck: no adenopathy, no carotid bruit, no JVD, supple, symmetrical, trachea midline and thyroid not enlarged, symmetric, no tenderness/mass/nodules Lungs: clear to auscultation bilaterally Breasts: normal appearance, no masses or tenderness Heart: regular rate and rhythm, S1, S2 normal, no murmur, click, rub or gallop Abdomen: soft, non-tender; bowel sounds normal; no masses,  no organomegaly Extremities: extremities normal, atraumatic, no cyanosis or edema Pulses: 2+ and symmetric Skin: Skin color, texture, turgor normal. No rashes or lesions Neurologic: Alert and oriented X 3, normal strength and tone. Normal symmetric reflexes. Normal coordination and gait.  Assessment and Plan:   Osteoporosis, post-menopausal Discussed Prolia since all other therapies were not tolerated or successful.  Repeat DEXA ordered  IBS  (irritable bowel syndrome) By history,  chronic post prandial discomfort due to cramping and bloating.   Multiple food intolerances oddered trail fo bentyl,  And advised to try Beano with all meals containing vegetables   Routine general medical examination at a health care facility Annual Medicare wellness  exam was done as well as a comprehensive physical exam and management of acute and chronic conditions .  During the course of the visit the patient was educated and counseled about appropriate screening and preventive services including : fall prevention , diabetes screening, nutrition counseling, colorectal cancer screening, and recommended immunizations.  Printed recommendations for health maintenance screenings was given.    Updated Medication List Outpatient Encounter Prescriptions as of 07/13/2014  Medication Sig  . latanoprost (XALATAN) 0.005 % ophthalmic solution Place 1 drop into both eyes Nightly.  . timolol (BETIMOL) 0.25 % ophthalmic solution Place 1 drop into the left eye daily.  Marland Kitchen dicyclomine (BENTYL) 20 MG tablet Take 1 tablet (20 mg total) by mouth every 6 (six) hours.  Marland Kitchen zoster vaccine live, PF, (ZOSTAVAX) 53614 UNT/0.65ML injection Inject 19,400 Units into the skin once.  . zoster vaccine live, PF, (ZOSTAVAX) 43154 UNT/0.65ML injection Inject 19,400 Units into the skin once.  . [DISCONTINUED] azithromycin (ZITHROMAX) 250 MG tablet Start 2 tablets today then 1 tablet daily for 4 days.  . [DISCONTINUED] bimatoprost (LUMIGAN) 0.03 % ophthalmic solution Place 1 drop into both eyes at bedtime.  . [DISCONTINUED] TDaP (BOOSTRIX) 5-2.5-18.5 LF-MCG/0.5 injection Inject 0.5 mLs into the muscle once.

## 2014-07-13 NOTE — Assessment & Plan Note (Signed)
Discussed Prolia since all other therapies were not tolerated or successful.  Repeat DEXA ordered

## 2014-07-13 NOTE — Progress Notes (Signed)
Pre-visit discussion using our clinic review tool. No additional management support is needed unless otherwise documented below in the visit note.  

## 2014-07-13 NOTE — Assessment & Plan Note (Signed)

## 2014-07-14 NOTE — Telephone Encounter (Signed)
Mailed unread message to pt  

## 2014-07-21 ENCOUNTER — Telehealth: Payer: Self-pay | Admitting: Internal Medicine

## 2014-07-21 NOTE — Telephone Encounter (Signed)
Patient called questioning status of 3-D Mammogram referral, please advise?

## 2014-07-21 NOTE — Telephone Encounter (Signed)
Referral is in process as requested but [facility] still doesn't have it up and running.  I think it would be better to go to [place] . If she is ok with that change in venue I'll notify Hoyle Sauer

## 2014-07-22 NOTE — Telephone Encounter (Signed)
Sent mychart message

## 2014-09-01 ENCOUNTER — Ambulatory Visit
Admission: RE | Admit: 2014-09-01 | Discharge: 2014-09-01 | Disposition: A | Discharge status: Home / Self Care | Payer: Medicare Other | Source: Ambulatory Visit | Attending: Internal Medicine | Admitting: Internal Medicine

## 2014-09-01 ENCOUNTER — Other Ambulatory Visit: Payer: Self-pay | Admitting: Internal Medicine

## 2014-09-01 DIAGNOSIS — Z1231 Encounter for screening mammogram for malignant neoplasm of breast: Secondary | ICD-10-CM

## 2014-12-03 HISTORY — PX: CATARACT EXTRACTION, BILATERAL: SHX1313

## 2015-01-10 ENCOUNTER — Ambulatory Visit (INDEPENDENT_AMBULATORY_CARE_PROVIDER_SITE_OTHER): Payer: PPO | Admitting: Internal Medicine

## 2015-01-10 ENCOUNTER — Encounter: Payer: Self-pay | Admitting: Internal Medicine

## 2015-01-10 VITALS — BP 108/70 | HR 109 | Temp 98.7°F | Resp 16 | Ht 61.0 in | Wt 100.2 lb

## 2015-01-10 DIAGNOSIS — J0100 Acute maxillary sinusitis, unspecified: Secondary | ICD-10-CM

## 2015-01-10 MED ORDER — LEVOFLOXACIN 500 MG PO TABS: 500.0000 mg | ORAL_TABLET | Freq: Every day | ORAL | Status: DC

## 2015-01-10 MED ORDER — PREDNISONE (PAK) 10 MG PO TABS: ORAL_TABLET | ORAL | Status: DC

## 2015-01-10 NOTE — Progress Notes (Signed)
Patient ID: Nicole Nixon, female   DOB: June 02, 1946, 69 y.o.   MRN: [id number]   Patient Active Problem List   Diagnosis Date Noted  . Sinusitis, acute, maxillary 01/11/2015  . IBS (irritable bowel syndrome) 07/13/2014  . Right elbow pain 09/04/2013  . Routine general medical examination at a health care facility 04/21/2013  . Osteoporosis, post-menopausal 12/16/2012  . Vitamin D deficiency 12/16/2012  . Chronic insomnia 12/16/2012    Subjective:  CC:   Chief Complaint  Patient presents with  . Acute Visit    .Sinus pressure and congestion white colored mucus has been taking loratidine D.    HPI:   Nicole Nixon is a 69 y.o. female who presents for  A  2 WEEK history of  OF SINUS PRESSURE AND CONGESTION,  RHINITIS WITH clear to white DISCHARGE,  Denies sick contacts,  FEVERS,  But having frontal and maxillary sinus tenderness for the last 4 days . Upper teeth hurt,  No appetite,  Feels generally lousy,   USING CLARITIN D    Past Medical History  Diagnosis Date  . 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 Glaucoma     Past Surgical History  Procedure Laterality Date  . Appendectomy  1975  . Tonsillectomy and adenoidectomy  1958  . Cesarean section  1978  . Dilation and curettage of uterus  1975       The following portions of the patient's history were reviewed and updated as appropriate: Allergies, current medications, and problem list.    Review of Systems:   Patient denies headache, fevers, malaise, unintentional weight loss, skin rash, eye pain, sinus congestion and sinus pain, sore throat, dysphagia,  hemoptysis , cough, dyspnea, wheezing, chest pain, palpitations, orthopnea, edema, abdominal pain, nausea, melena, diarrhea, constipation, flank pain, dysuria, hematuria, urinary  Frequency, nocturia, numbness, tingling, seizures,  Focal weakness, Loss of consciousness,  Tremor, insomnia, depression, anxiety, and suicidal ideation.     History    Social History  . Marital Status: Married    Spouse Name: N/A    Number of Children: N/A  . Years of Education: N/A   Occupational History  . Not on file.   Social History Main Topics  . Smoking status: Former Smoker    Quit date: 12/16/1970  . Smokeless tobacco: Not on file  . Alcohol Use: No  . Drug Use: No  . Sexual Activity: Yes   Other Topics Concern  . Not on file   Social History Narrative    Objective:  Filed Vitals:   01/10/15 1812  BP: 108/70  Pulse: 109  Temp: 98.7 F (37.1 C)  Resp: 16     General appearance: alert, cooperative and appears stated age Ears: normal TM's and external ear canals both ears Throat: lips, mucosa, and tongue normal; teeth and gums normal Sinuses:  Tender frontal and  maxillary sinuses bilaterally Neck: cervical  adenopathy, no carotid bruit, supple, symmetrical, trachea midline and thyroid not enlarged,  Back: symmetric, no curvature. ROM normal. No CVA tenderness. Lungs: clear to auscultation bilaterally Heart: regular rate and rhythm, S1, S2 normal, no murmur, click, rub or gallop Abdomen: soft, non-tender; bowel sounds normal; no masses,  no organomegaly Pulses: 2+ and symmetric Skin: Skin color, texture, turgor normal. No rashes or lesions Lymph nodes: Cervical, supraclavicular, and axillary nodes normal.  Assessment and Plan:  Sinusitis, acute, maxillary Given chronicity of symptoms, development of facial pain and exam consistent with bacterial URI,  Will  treat with empiric antibiotics, prednisone taper, oral  decongestants, and saline lavage.  Daily use of Probiotics for  3 weeks advised to reduce risk of C dificile colitis.     Updated Medication List Outpatient Encounter Prescriptions as of 01/10/2015  Medication Sig  . dicyclomine (BENTYL) 20 MG tablet Take 1 tablet (20 mg total) by mouth every 6 (six) hours.  Marland Kitchen latanoprost (XALATAN) 0.005 % ophthalmic solution Place 1 drop into both eyes Nightly.  Marland Kitchen  levofloxacin (LEVAQUIN) 500 MG tablet Take 1 tablet (500 mg total) by mouth daily.  Marland Kitchen loratadine-pseudoephedrine (CLARITIN-D 12-HOUR) 5-120 MG per tablet Take 1 tablet by mouth 2 (two) times daily.  . predniSONE (STERAPRED UNI-PAK) 10 MG tablet 6 tablets on Day 1 , then reduce by 1 tablet daily until gone  . timolol (BETIMOL) 0.25 % ophthalmic solution Place 1 drop into the left eye daily.  Marland Kitchen zoster vaccine live, PF, (ZOSTAVAX) 65537 UNT/0.65ML injection Inject 19,400 Units into the skin once. (Patient not taking: Reported on 01/10/2015)  . zoster vaccine live, PF, (ZOSTAVAX) 48270 UNT/0.65ML injection Inject 19,400 Units into the skin once. (Patient not taking: Reported on 01/10/2015)     No orders of the defined types were placed in this encounter.    No Follow-up on file.

## 2015-01-10 NOTE — Patient Instructions (Signed)
I am treating you for sinusitis  which is a complication from your viral infection due to  persistent sinus congestion.   I am prescribing an antibiotic (levaquin) and a prednisone taper  To manage the infection and the inflammation in your ear/sinuses.   I also advise use of the following OTC meds to help with your other symptoms.   Take generic OTC benadryl 25 mg every 8 hours for the drainage,  Sudafed PE  10 to 30 mg every 8 hours for the congestion, you may substitute Afrin nasal spray for the nighttime dose of sudafed PE  If needed to prevent insomnia.  flush your sinuses twice daily with Simply Saline OR NEIL'S SINUS RINSE (do over the sink because if you do it right you will spit out globs of mucus)   Please take a probiotic ( Align, Floraque or Culturelle) FOR A TOTAL OF 3 WEEKS while you are on the antibiotic to prevent  the  serious antibiotic associated diarrhea  Called clostridium dificile colitis and a vaginal yeast infection

## 2015-01-10 NOTE — Progress Notes (Signed)
Pre-visit discussion using our clinic review tool. No additional management support is needed unless otherwise documented below in the visit note.  

## 2015-01-11 DIAGNOSIS — J0100 Acute maxillary sinusitis, unspecified: Secondary | ICD-10-CM | POA: Insufficient documentation

## 2015-01-11 NOTE — Assessment & Plan Note (Signed)
Given chronicity of symptoms, development of facial pain and exam consistent with bacterial URI,  Will treat with empiric antibiotics, prednisone taper, oral  decongestants, and saline lavage.  Daily use of Probiotics for  3 weeks advised to reduce risk of C dificile colitis.

## 2015-09-20 ENCOUNTER — Ambulatory Visit (INDEPENDENT_AMBULATORY_CARE_PROVIDER_SITE_OTHER): Payer: PPO | Admitting: Internal Medicine

## 2015-09-20 ENCOUNTER — Encounter: Payer: Self-pay | Admitting: Internal Medicine

## 2015-09-20 VITALS — BP 102/70 | HR 73 | Temp 98.1°F | Resp 12 | Ht 60.5 in | Wt 102.4 lb

## 2015-09-20 DIAGNOSIS — Z113 Encounter for screening for infections with a predominantly sexual mode of transmission: Secondary | ICD-10-CM

## 2015-09-20 DIAGNOSIS — M810 Age-related osteoporosis without current pathological fracture: Secondary | ICD-10-CM

## 2015-09-20 DIAGNOSIS — R5383 Other fatigue: Secondary | ICD-10-CM

## 2015-09-20 DIAGNOSIS — Z85828 Personal history of other malignant neoplasm of skin: Secondary | ICD-10-CM

## 2015-09-20 DIAGNOSIS — Z1239 Encounter for other screening for malignant neoplasm of breast: Secondary | ICD-10-CM | POA: Diagnosis not present

## 2015-09-20 DIAGNOSIS — Z0000 Encounter for general adult medical examination without abnormal findings: Secondary | ICD-10-CM

## 2015-09-20 DIAGNOSIS — Z1211 Encounter for screening for malignant neoplasm of colon: Secondary | ICD-10-CM | POA: Diagnosis not present

## 2015-09-20 DIAGNOSIS — E785 Hyperlipidemia, unspecified: Secondary | ICD-10-CM

## 2015-09-20 DIAGNOSIS — Z23 Encounter for immunization: Secondary | ICD-10-CM

## 2015-09-20 NOTE — Patient Instructions (Signed)

## 2015-09-20 NOTE — Progress Notes (Signed)
Pre-visit discussion using our clinic review tool. No additional management support is needed unless otherwise documented below in the visit note.  

## 2015-09-20 NOTE — Progress Notes (Signed)
Patient ID: Nicole Nixon, female    DOB: 11-13-1946  Age: 69 y.o. MRN: [id number]  The patient is here for annual Medicare wellness examination and management of other chronic and acute problems.   Had bilateral cataract surgery by [facility] in [place].  Her vision has Vision improved    The risk factors are reflected in the social history.  The roster of all physicians providing medical care to patient - is listed in the Snapshot section of the chart.  Activities of daily living:  The patient is 100% independent in all ADLs: dressing, toileting, feeding as well as independent mobility  Home safety : The patient has smoke detectors in the home. They wear seatbelts.  There are no firearms at home. There is no violence in the home.   There is no risks for hepatitis, STDs or HIV. There is no   history of blood transfusion. They have no travel history to infectious disease endemic areas of the world.  The patient has seen their dentist in the last six month. They have seen their eye doctor in the last year. They admit to slight hearing difficulty with regard to whispered voices and some television programs.  They have deferred audiologic testing in the last year.  They do not  have excessive sun exposure. Discussed the need for sun protection: hats, long sleeves and use of sunscreen if there is significant sun exposure.   Diet: the importance of a healthy diet is discussed. They do have a healthy diet.  The benefits of regular aerobic exercise were discussed. She walks 4 times per week ,  20 minutes.   Depression screen: there are no signs or vegative symptoms of depression- irritability, change in appetite, anhedonia, sadness/tearfullness.  Cognitive assessment: the patient manages all their financial and personal affairs and is actively engaged. They could relate day,date,year and events; recalled 2/3 objects at 3 minutes; performed clock-face test normally.  The following portions of the  patient's history were reviewed and updated as appropriate: allergies, current medications, past family history, past medical history,  past surgical history, past social history  and problem list.  Visual acuity was not assessed per patient preference since she has regular follow up with her ophthalmologist. Hearing and body mass index were assessed and reviewed.   During the course of the visit the patient was educated and counseled about appropriate screening and preventive services including : fall prevention , diabetes screening, nutrition counseling, colorectal cancer screening, and recommended immunizations.    CC: The primary encounter diagnosis was Colon cancer screening. Diagnoses of Breast cancer screening, Osteoporosis, History of SCC (squamous cell carcinoma) of skin, Screen for STD (sexually transmitted disease), Other fatigue, Hyperlipidemia, Need for prophylactic vaccination against Streptococcus pneumoniae (pneumococcus), Medicare annual wellness visit, subsequent, and Osteoporosis, post-menopausal were also pertinent to this visit.  History Nicole Nixon has a past medical history of Cancer ([place]) (2009) and Glaucoma.   She has past surgical history that includes Appendectomy (1975); Tonsillectomy and adenoidectomy (1958); Cesarean section (1978); and Dilation and curettage of uterus (1975).   Her family history is not on file. She was adopted.She reports that she quit smoking about 44 years ago. She does not have any smokeless tobacco history on file. She reports that she does not drink alcohol or use illicit drugs.  Outpatient Prescriptions Prior to Visit  Medication Sig Dispense Refill  . dicyclomine (BENTYL) 20 MG tablet Take 1 tablet (20 mg total) by mouth every 6 (six) hours. 90 tablet 2  .  levofloxacin (LEVAQUIN) 500 MG tablet Take 1 tablet (500 mg total) by mouth daily. 7 tablet 0  . loratadine-pseudoephedrine (CLARITIN-D 12-HOUR) 5-120 MG per tablet Take 1 tablet by mouth 2  (two) times daily.    . predniSONE (STERAPRED UNI-PAK) 10 MG tablet 6 tablets on Day 1 , then reduce by 1 tablet daily until gone 21 tablet 0  . timolol (BETIMOL) 0.25 % ophthalmic solution Apply 1 drop to eye 2 (two) times daily.     Marland Kitchen zoster vaccine live, PF, (ZOSTAVAX) 02409 UNT/0.65ML injection Inject 19,400 Units into the skin once. (Patient not taking: Reported on 09/20/2015) 1 each 0  . zoster vaccine live, PF, (ZOSTAVAX) 73532 UNT/0.65ML injection Inject 19,400 Units into the skin once. (Patient not taking: Reported on 09/20/2015) 1 each 0  . latanoprost (XALATAN) 0.005 % ophthalmic solution Place 1 drop into both eyes Nightly.     No facility-administered medications prior to visit.    Review of Systems   Patient denies headache, fevers, malaise, unintentional weight loss, skin rash, eye pain, sinus congestion and sinus pain, sore throat, dysphagia,  hemoptysis , cough, dyspnea, wheezing, chest pain, palpitations, orthopnea, edema, abdominal pain, nausea, melena, diarrhea, constipation, flank pain, dysuria, hematuria, urinary  Frequency, nocturia, numbness, tingling, seizures,  Focal weakness, Loss of consciousness,  Tremor, insomnia, depression, anxiety, and suicidal ideation.      Objective:  BP 102/70 mmHg  Pulse 73  Temp(Src) 98.1 F (36.7 C) (Oral)  Resp 12  Ht 5' 0.5" (1.537 m)  Wt 102 lb 6 oz (46.437 kg)  BMI 19.66 kg/m2  SpO2 96%  Physical Exam   General appearance: alert, cooperative and appears stated age Head: Normocephalic, without obvious abnormality, atraumatic Eyes: conjunctivae/corneas clear. PERRL, EOM's intact. Fundi benign. Ears: normal TM's and external ear canals both ears Nose: Nares normal. Septum midline. Mucosa normal. No drainage or sinus tenderness. Throat: lips, mucosa, and tongue normal; teeth and gums normal Neck: no adenopathy, no carotid bruit, no JVD, supple, symmetrical, trachea midline and thyroid not enlarged, symmetric, no  tenderness/mass/nodules Lungs: clear to auscultation bilaterally Breasts: normal appearance, no masses or tenderness Heart: regular rate and rhythm, S1, S2 normal, no murmur, click, rub or gallop Abdomen: soft, non-tender; bowel sounds normal; no masses,  no organomegaly Extremities: extremities normal, atraumatic, no cyanosis or edema Pulses: 2+ and symmetric Skin: Skin color, texture, turgor normal. No rashes or lesions Neurologic: Alert and oriented X 3, normal strength and tone. Normal symmetric reflexes. Normal coordination and gait.    Assessment & Plan:   Problem List Items Addressed This Visit    Osteoporosis, post-menopausal    By prior DEXA ,with lack of response to prior trials of bisphosphonates , SERMS and PTH. She is not interested in pursuing treatment with Prolia.         Medicare annual wellness visit, subsequent    Annual Medicare wellness  exam was done as well as a comprehensive physical exam and management of acute and chronic conditions .  During the course of the visit the patient was educated and counseled about appropriate screening and preventive services including : fall prevention , diabetes screening, nutrition counseling, colorectal cancer screening, and recommended immunizations.  Printed recommendations for health maintenance screenings was given.        Other Visit Diagnoses    Colon cancer screening    -  Primary    Relevant Orders    Ambulatory referral to Gastroenterology    Breast cancer screening  Relevant Orders    MM DIGITAL SCREENING BILATERAL    Osteoporosis        Relevant Orders    DG Bone Density    History of SCC (squamous cell carcinoma) of skin        Relevant Orders    Ambulatory referral to Dermatology    Screen for STD (sexually transmitted disease)        Relevant Orders    HIV antibody    Hepatitis C antibody    Other fatigue        Relevant Orders    CBC with Differential/Platelet    Comprehensive metabolic panel     TSH    Hyperlipidemia        Relevant Orders    Lipid panel    Need for prophylactic vaccination against Streptococcus pneumoniae (pneumococcus)           I have discontinued Ms. Bayly's latanoprost. I am also having her maintain her zoster vaccine live (PF), timolol, dicyclomine, zoster vaccine live (PF), loratadine-pseudoephedrine, levofloxacin, and predniSONE.  No orders of the defined types were placed in this encounter.    Medications Discontinued During This Encounter  Medication Reason  . latanoprost (XALATAN) 0.005 % ophthalmic solution Error    Follow-up: No Follow-up on file.   Crecencio Mc, MD

## 2015-09-21 NOTE — Assessment & Plan Note (Signed)

## 2015-09-21 NOTE — Assessment & Plan Note (Signed)
By prior DEXA ,with lack of response to prior trials of bisphosphonates , SERMS and PTH. She is not interested in pursuing treatment with Prolia.    

## 2015-10-06 ENCOUNTER — Ambulatory Visit
Admission: RE | Admit: 2015-10-06 | Discharge: 2015-10-06 | Disposition: A | Discharge status: Home / Self Care | Payer: PPO | Source: Ambulatory Visit | Attending: Internal Medicine | Admitting: Internal Medicine

## 2015-10-06 ENCOUNTER — Other Ambulatory Visit: Payer: Self-pay | Admitting: Internal Medicine

## 2015-10-06 DIAGNOSIS — Z1231 Encounter for screening mammogram for malignant neoplasm of breast: Secondary | ICD-10-CM | POA: Insufficient documentation

## 2015-10-06 DIAGNOSIS — M810 Age-related osteoporosis without current pathological fracture: Secondary | ICD-10-CM | POA: Insufficient documentation

## 2015-10-06 DIAGNOSIS — Z1239 Encounter for other screening for malignant neoplasm of breast: Secondary | ICD-10-CM

## 2015-10-08 ENCOUNTER — Encounter: Payer: Self-pay | Admitting: Internal Medicine

## 2015-10-12 ENCOUNTER — Other Ambulatory Visit: Payer: PPO

## 2015-11-11 ENCOUNTER — Other Ambulatory Visit (INDEPENDENT_AMBULATORY_CARE_PROVIDER_SITE_OTHER): Payer: PPO

## 2015-11-11 DIAGNOSIS — Z113 Encounter for screening for infections with a predominantly sexual mode of transmission: Secondary | ICD-10-CM

## 2015-11-11 DIAGNOSIS — R5383 Other fatigue: Secondary | ICD-10-CM

## 2015-11-11 DIAGNOSIS — E785 Hyperlipidemia, unspecified: Secondary | ICD-10-CM

## 2015-11-11 LAB — CBC WITH DIFFERENTIAL/PLATELET
BASOS PCT: 0.9 % (ref 0.0–3.0)
Basophils Absolute: 0 10*3/uL (ref 0.0–0.1)
EOS ABS: 0.3 10*3/uL (ref 0.0–0.7)
Eosinophils Relative: 6.4 % — ABNORMAL HIGH (ref 0.0–5.0)
HEMATOCRIT: 42.4 % (ref 36.0–46.0)
HEMOGLOBIN: 14.1 g/dL (ref 12.0–15.0)
LYMPHS PCT: 46.2 % — AB (ref 12.0–46.0)
Lymphs Abs: 1.8 10*3/uL (ref 0.7–4.0)
MCHC: 33.4 g/dL (ref 30.0–36.0)
MCV: 91.9 fl (ref 78.0–100.0)
MONO ABS: 0.5 10*3/uL (ref 0.1–1.0)
Monocytes Relative: 12.7 % — ABNORMAL HIGH (ref 3.0–12.0)
Neutro Abs: 1.3 10*3/uL — ABNORMAL LOW (ref 1.4–7.7)
Neutrophils Relative %: 33.8 % — ABNORMAL LOW (ref 43.0–77.0)
Platelets: 260 10*3/uL (ref 150.0–400.0)
RBC: 4.61 Mil/uL (ref 3.87–5.11)
RDW: 13.4 % (ref 11.5–15.5)
WBC: 4 10*3/uL (ref 4.0–10.5)

## 2015-11-11 LAB — LIPID PANEL
CHOLESTEROL: 200 mg/dL (ref 0–200)
HDL: 57.2 mg/dL (ref >39.00)
LDL CALC: 127 mg/dL — AB (ref 0–99)
NonHDL: 143.25
TRIGLYCERIDES: 83 mg/dL (ref 0.0–149.0)
Total CHOL/HDL Ratio: 4
VLDL: 16.6 mg/dL (ref 0.0–40.0)

## 2015-11-11 LAB — COMPREHENSIVE METABOLIC PANEL
ALBUMIN: 4 g/dL (ref 3.5–5.2)
ALT: 13 U/L (ref 0–35)
AST: 18 U/L (ref 0–37)
Alkaline Phosphatase: 83 U/L (ref 39–117)
BUN: 13 mg/dL (ref 6–23)
CALCIUM: 9.1 mg/dL (ref 8.4–10.5)
CHLORIDE: 103 meq/L (ref 96–112)
CO2: 30 meq/L (ref 19–32)
Creatinine, Ser: 0.79 mg/dL (ref 0.40–1.20)
GFR: 76.93 mL/min (ref >60.00)
Glucose, Bld: 98 mg/dL (ref 70–99)
POTASSIUM: 4.4 meq/L (ref 3.5–5.1)
SODIUM: 141 meq/L (ref 135–145)
TOTAL PROTEIN: 6.8 g/dL (ref 6.0–8.3)
Total Bilirubin: 0.5 mg/dL (ref 0.2–1.2)

## 2015-11-11 LAB — TSH: TSH: 2.14 u[IU]/mL (ref 0.35–4.50)

## 2015-11-12 LAB — HEPATITIS C ANTIBODY: HCV Ab: NEGATIVE

## 2015-11-12 LAB — HIV ANTIBODY (ROUTINE TESTING W REFLEX): HIV: NONREACTIVE

## 2015-11-13 ENCOUNTER — Encounter: Payer: Self-pay | Admitting: Internal Medicine

## 2015-11-30 NOTE — Telephone Encounter (Signed)
Mailed unread message to patient.  

## 2015-12-06 DIAGNOSIS — Z85828 Personal history of other malignant neoplasm of skin: Secondary | ICD-10-CM | POA: Diagnosis not present

## 2015-12-06 DIAGNOSIS — D2262 Melanocytic nevi of left upper limb, including shoulder: Secondary | ICD-10-CM | POA: Diagnosis not present

## 2015-12-06 DIAGNOSIS — D2272 Melanocytic nevi of left lower limb, including hip: Secondary | ICD-10-CM | POA: Diagnosis not present

## 2015-12-06 DIAGNOSIS — D485 Neoplasm of uncertain behavior of skin: Secondary | ICD-10-CM | POA: Diagnosis not present

## 2015-12-06 DIAGNOSIS — D225 Melanocytic nevi of trunk: Secondary | ICD-10-CM | POA: Diagnosis not present

## 2016-03-12 DIAGNOSIS — H401132 Primary open-angle glaucoma, bilateral, moderate stage: Secondary | ICD-10-CM | POA: Diagnosis not present

## 2016-06-14 DIAGNOSIS — E854 Organ-limited amyloidosis: Secondary | ICD-10-CM | POA: Diagnosis not present

## 2016-07-09 DIAGNOSIS — Z961 Presence of intraocular lens: Secondary | ICD-10-CM | POA: Diagnosis not present

## 2016-07-09 DIAGNOSIS — H401131 Primary open-angle glaucoma, bilateral, mild stage: Secondary | ICD-10-CM | POA: Diagnosis not present

## 2016-11-06 ENCOUNTER — Ambulatory Visit (INDEPENDENT_AMBULATORY_CARE_PROVIDER_SITE_OTHER): Payer: PPO

## 2016-11-06 ENCOUNTER — Other Ambulatory Visit: Payer: Self-pay | Admitting: Internal Medicine

## 2016-11-06 VITALS — BP 102/68 | HR 67 | Temp 98.0°F | Resp 12 | Ht 61.0 in | Wt 104.8 lb

## 2016-11-06 DIAGNOSIS — Z0000 Encounter for general adult medical examination without abnormal findings: Secondary | ICD-10-CM

## 2016-11-06 DIAGNOSIS — H6990 Unspecified Eustachian tube disorder, unspecified ear: Secondary | ICD-10-CM

## 2016-11-06 NOTE — Assessment & Plan Note (Signed)
Patient reporting persistent congestion,  Ear drainage, and bad taste in mouth, ent referral requested during wellness visit Nicole Nixon

## 2016-11-06 NOTE — Progress Notes (Signed)
Subjective:   Nicole Nixon is a 70 y.o. female who presents for Medicare Annual (Subsequent) preventive examination.  Review of Systems:  No ROS.  Medicare Wellness Visit.  Cardiac Risk Factors include: advanced age (>5men, >25 women)     Objective:     Vitals: BP 102/68 (BP Location: Left Arm, Patient Position: Sitting, Cuff Size: Normal)   Pulse 67   Temp 98 F (36.7 C) (Oral)   Resp 12   Ht 5\' 1"  (1.549 m)   Wt 104 lb 12.8 oz (47.5 kg)   SpO2 96%   BMI 19.80 kg/m   Body mass index is 19.8 kg/m.   Tobacco History  Smoking Status  . Former Smoker  . Quit date: 12/16/1970  Smokeless Tobacco  . Not on file     Counseling given: Not Answered   Past Medical History:  Diagnosis Date  . Cancer ([place]) 2009   basal cell on forehead  . Glaucoma    Past Surgical History:  Procedure Laterality Date  . APPENDECTOMY  1975  . [place]  . DILATION AND CURETTAGE OF UTERUS  1975  . TONSILLECTOMY AND ADENOIDECTOMY  1958   Family History  Problem Relation Age of Onset  . Adopted: Yes  . Family history unknown: Yes   History  Sexual Activity  . Sexual activity: Yes    Outpatient Encounter Prescriptions as of 11/06/2016  Medication Sig  . timolol (BETIMOL) 0.25 % ophthalmic solution Apply 1 drop to eye 2 (two) times daily.   . [DISCONTINUED] levofloxacin (LEVAQUIN) 500 MG tablet Take 1 tablet (500 mg total) by mouth daily.  . [DISCONTINUED] predniSONE (STERAPRED UNI-PAK) 10 MG tablet 6 tablets on Day 1 , then reduce by 1 tablet daily until gone  . dicyclomine (BENTYL) 20 MG tablet Take 1 tablet (20 mg total) by mouth every 6 (six) hours. (Patient not taking: Reported on 11/06/2016)  . loratadine-pseudoephedrine (CLARITIN-D 12-HOUR) 5-120 MG per tablet Take 1 tablet by mouth 2 (two) times daily.  Marland Kitchen MAGNESIUM PO Take 1 tablet by mouth.  . zoster vaccine live, PF, (ZOSTAVAX) 60454 UNT/0.65ML injection Inject 19,400 Units into the skin once. (Patient  not taking: Reported on 11/06/2016)  . zoster vaccine live, PF, (ZOSTAVAX) 09811 UNT/0.65ML injection Inject 19,400 Units into the skin once. (Patient not taking: Reported on 11/06/2016)   No facility-administered encounter medications on file as of 11/06/2016.     Activities of Daily Living In your present state of health, do you have any difficulty performing the following activities: 11/06/2016  Hearing? N  Vision? N  Difficulty concentrating or making decisions? N  Walking or climbing stairs? N  Dressing or bathing? N  Doing errands, shopping? N  Preparing Food and eating ? N  Using the Toilet? N  In the past six months, have you accidently leaked urine? N  Do you have problems with loss of bowel control? N  Managing your Medications? N  Managing your Finances? N  Housekeeping or managing your Housekeeping? N  Some recent data might be hidden    Patient Care Team: Crecencio Mc, MD as PCP - General (Internal Medicine)    Assessment:    This is a routine wellness examination for Nicole Nixon. The goal of the wellness visit is to assist the patient how to close the gaps in care and create a preventative care plan for the patient.   Osteoporosis reviewed.  Medications reviewed; taking without issues or barriers.  Safety issues reviewed;  lives with husband.  Smoke and carbon monoxide detectors in the home. Firearms locked in a safe within the home. Wears seatbelts when driving or riding with others. No violence in the home.  No identified risk were noted; The patient was oriented x 3; appropriate in dress and manner and no objective failures at ADL's or IADL's.   BMI; normal.  Discussed the importance of a healthy diet, water intake and exercise. Educational material provided.  Colonoscopy/COLOGUARD discussed; deferred per patient request.  Educational material provided.  Influenza and ZOSTAVAX vaccine postponed; not a candidate.  Patient Concerns: Ear drainage; deferred  to PCP for follow up.  Exercise Activities and Dietary recommendations Current Exercise Habits: Home exercise routine, Type of exercise: walking;calisthenics, Time (Minutes): 30, Intensity: Moderate  Goals    . Healthy Lifestyle          Stay hydrated and drink plenty of water Stay active and continue walking for exercise.  Increase as tolerated. Low carb foods.  Vegetables, lean meats.      Fall Risk Fall Risk  11/06/2016 09/20/2015 03/19/2014  Falls in the past year? No No No   Depression Screen PHQ 2/9 Scores 11/06/2016 09/20/2015 03/19/2014  PHQ - 2 Score 0 0 0     Cognitive Function MMSE - Mini Mental State Exam 11/06/2016  Orientation to time 5  Orientation to Place 5  Registration 3  Attention/ Calculation 5  Recall 3  Language- name 2 objects 2  Language- repeat 1  Language- follow 3 step command 3  Language- read & follow direction 1  Write a sentence 1  Copy design 1  Total score 30        Immunization History  Administered Date(s) Administered  . Pneumococcal Conjugate-13 09/20/2015  . Pneumococcal Polysaccharide-23 04/21/2013  . Tdap 03/14/2010   Screening Tests Health Maintenance  Topic Date Due  . COLONOSCOPY  07/14/2015  . ZOSTAVAX  12/14/2016 (Originally 11/19/2007)  . INFLUENZA VACCINE  12/17/2016 (Originally 07/03/2016)  . MAMMOGRAM  10/05/2017  . TETANUS/TDAP  03/14/2020  . DEXA SCAN  Completed  . Hepatitis C Screening  Completed  . PNA vac Low Risk Adult  Completed      Plan:    End of life planning; Advance aging; Advanced directives discussed. Copy of current HCPOA/Living Will requested.  Medicare Attestation I have personally reviewed: The patient's medical and social history Their use of alcohol, tobacco or illicit drugs Their current medications and supplements The patient's functional ability including ADLs,fall risks, home safety risks, cognitive, and hearing and visual impairment Diet and physical activities Evidence for  depression   The patient's weight, height, BMI, and visual acuity have been recorded in the chart.  I have made referrals and provided education to the patient based on review of the above and I have provided the patient with a written personalized care plan for preventive services.    During the course of the visit the patient was educated and counseled about the following appropriate screening and preventive services:   Vaccines to include Pneumoccal, Influenza, Hepatitis B, Td, Zostavax, HCV  Electrocardiogram  Cardiovascular Disease  Colorectal cancer screening  Bone density screening  Diabetes screening  Glaucoma screening  Mammography/PAP  Nutrition counseling   Patient Instructions (the written plan) was given to the patient.   Varney Biles, LPN  [id number]

## 2016-11-06 NOTE — Patient Instructions (Addendum)
  Nicole Nixon , Thank you for taking time to come for your Medicare Wellness Visit. I appreciate your ongoing commitment to your health goals. Please review the following plan we discussed and let me know if I can assist you in the future.   FOLLOW UP WITH DR. Derrel Nip AS NEEDED.  These are the goals we discussed: Goals    . Healthy Lifestyle          Stay hydrated and drink plenty of water Stay active and continue walking for exercise.  Increase as tolerated. Low carb foods.  Vegetables, lean meats.       This is a list of the screening recommended for you and due dates:  Health Maintenance  Topic Date Due  . Colon Cancer Screening  07/14/2015  . Shingles Vaccine  12/14/2016*  . Flu Shot  12/17/2016*  . Mammogram  10/05/2017  . Tetanus Vaccine  03/14/2020  . DEXA scan (bone density measurement)  Completed  .  Hepatitis C: One time screening is recommended by Center for Disease Control  (CDC) for  adults born from [birth year] through 1964.   Completed  . Pneumonia vaccines  Completed  *Topic was postponed. The date shown is not the original due date.

## 2016-11-07 ENCOUNTER — Other Ambulatory Visit: Payer: Self-pay

## 2016-11-07 DIAGNOSIS — E7849 Other hyperlipidemia: Secondary | ICD-10-CM

## 2016-11-07 DIAGNOSIS — R5383 Other fatigue: Secondary | ICD-10-CM

## 2016-11-08 DIAGNOSIS — H401131 Primary open-angle glaucoma, bilateral, mild stage: Secondary | ICD-10-CM | POA: Diagnosis not present

## 2016-11-08 NOTE — Progress Notes (Signed)
I reviewed the above note and agree. Patient should be scheduled for follow-up to discuss or ear drainage with her PCP.  Tommi Rumps, M.D.

## 2016-11-09 DIAGNOSIS — M9903 Segmental and somatic dysfunction of lumbar region: Secondary | ICD-10-CM | POA: Diagnosis not present

## 2016-11-09 DIAGNOSIS — M5442 Lumbago with sciatica, left side: Secondary | ICD-10-CM | POA: Diagnosis not present

## 2016-11-12 ENCOUNTER — Encounter: Payer: Self-pay | Admitting: Internal Medicine

## 2016-11-21 DIAGNOSIS — J309 Allergic rhinitis, unspecified: Secondary | ICD-10-CM | POA: Diagnosis not present

## 2016-11-21 DIAGNOSIS — R51 Headache: Secondary | ICD-10-CM | POA: Diagnosis not present

## 2016-11-30 ENCOUNTER — Other Ambulatory Visit: Payer: Self-pay | Admitting: Internal Medicine

## 2016-12-05 DIAGNOSIS — Z08 Encounter for follow-up examination after completed treatment for malignant neoplasm: Secondary | ICD-10-CM | POA: Diagnosis not present

## 2016-12-05 DIAGNOSIS — L821 Other seborrheic keratosis: Secondary | ICD-10-CM | POA: Diagnosis not present

## 2016-12-05 DIAGNOSIS — X32XXXA Exposure to sunlight, initial encounter: Secondary | ICD-10-CM | POA: Diagnosis not present

## 2016-12-05 DIAGNOSIS — Z85828 Personal history of other malignant neoplasm of skin: Secondary | ICD-10-CM | POA: Diagnosis not present

## 2016-12-05 DIAGNOSIS — D1801 Hemangioma of skin and subcutaneous tissue: Secondary | ICD-10-CM | POA: Diagnosis not present

## 2016-12-05 DIAGNOSIS — L570 Actinic keratosis: Secondary | ICD-10-CM | POA: Diagnosis not present

## 2016-12-07 ENCOUNTER — Other Ambulatory Visit (INDEPENDENT_AMBULATORY_CARE_PROVIDER_SITE_OTHER): Payer: PPO

## 2016-12-07 DIAGNOSIS — E784 Other hyperlipidemia: Secondary | ICD-10-CM | POA: Diagnosis not present

## 2016-12-07 DIAGNOSIS — R5383 Other fatigue: Secondary | ICD-10-CM | POA: Diagnosis not present

## 2016-12-07 DIAGNOSIS — E7849 Other hyperlipidemia: Secondary | ICD-10-CM

## 2016-12-07 LAB — COMPREHENSIVE METABOLIC PANEL
ALBUMIN: 4.5 g/dL (ref 3.5–5.2)
ALK PHOS: 93 U/L (ref 39–117)
ALT: 14 U/L (ref 0–35)
AST: 18 U/L (ref 0–37)
BUN: 21 mg/dL (ref 6–23)
CALCIUM: 9.7 mg/dL (ref 8.4–10.5)
CO2: 34 mEq/L — ABNORMAL HIGH (ref 19–32)
CREATININE: 0.83 mg/dL (ref 0.40–1.20)
Chloride: 103 mEq/L (ref 96–112)
GFR: 72.43 mL/min (ref >60.00)
Glucose, Bld: 104 mg/dL — ABNORMAL HIGH (ref 70–99)
POTASSIUM: 4.8 meq/L (ref 3.5–5.1)
SODIUM: 142 meq/L (ref 135–145)
Total Bilirubin: 0.5 mg/dL (ref 0.2–1.2)
Total Protein: 7.3 g/dL (ref 6.0–8.3)

## 2016-12-07 LAB — CBC WITH DIFFERENTIAL/PLATELET
Basophils Absolute: 0 10*3/uL (ref 0.0–0.1)
Basophils Relative: 0.7 % (ref 0.0–3.0)
EOS ABS: 0.2 10*3/uL (ref 0.0–0.7)
Eosinophils Relative: 4 % (ref 0.0–5.0)
HCT: 42.2 % (ref 36.0–46.0)
HEMOGLOBIN: 14.5 g/dL (ref 12.0–15.0)
Lymphocytes Relative: 45.1 % (ref 12.0–46.0)
Lymphs Abs: 2.4 10*3/uL (ref 0.7–4.0)
MCHC: 34.3 g/dL (ref 30.0–36.0)
MCV: 91.2 fl (ref 78.0–100.0)
MONO ABS: 0.6 10*3/uL (ref 0.1–1.0)
Monocytes Relative: 11.9 % (ref 3.0–12.0)
Neutro Abs: 2.1 10*3/uL (ref 1.4–7.7)
Neutrophils Relative %: 38.3 % — ABNORMAL LOW (ref 43.0–77.0)
Platelets: 260 10*3/uL (ref 150.0–400.0)
RBC: 4.62 Mil/uL (ref 3.87–5.11)
RDW: 13.1 % (ref 11.5–15.5)
WBC: 5.4 10*3/uL (ref 4.0–10.5)

## 2016-12-07 LAB — LIPID PANEL
CHOLESTEROL: 238 mg/dL — AB (ref 0–200)
HDL: 64.5 mg/dL (ref >39.00)
LDL CALC: 152 mg/dL — AB (ref 0–99)
NonHDL: 173.5
TRIGLYCERIDES: 106 mg/dL (ref 0.0–149.0)
Total CHOL/HDL Ratio: 4
VLDL: 21.2 mg/dL (ref 0.0–40.0)

## 2016-12-07 LAB — TSH: TSH: 2.42 u[IU]/mL (ref 0.35–4.50)

## 2016-12-09 ENCOUNTER — Encounter: Payer: Self-pay | Admitting: Internal Medicine

## 2016-12-14 ENCOUNTER — Ambulatory Visit (INDEPENDENT_AMBULATORY_CARE_PROVIDER_SITE_OTHER): Payer: PPO | Admitting: Internal Medicine

## 2016-12-14 ENCOUNTER — Encounter: Payer: Self-pay | Admitting: Internal Medicine

## 2016-12-14 VITALS — BP 110/70 | HR 77 | Temp 98.1°F | Resp 16 | Ht 60.0 in | Wt 102.2 lb

## 2016-12-14 DIAGNOSIS — Z1211 Encounter for screening for malignant neoplasm of colon: Secondary | ICD-10-CM | POA: Diagnosis not present

## 2016-12-14 DIAGNOSIS — R7301 Impaired fasting glucose: Secondary | ICD-10-CM | POA: Diagnosis not present

## 2016-12-14 DIAGNOSIS — Z1239 Encounter for other screening for malignant neoplasm of breast: Secondary | ICD-10-CM

## 2016-12-14 DIAGNOSIS — Z1231 Encounter for screening mammogram for malignant neoplasm of breast: Secondary | ICD-10-CM | POA: Diagnosis not present

## 2016-12-14 DIAGNOSIS — Z0000 Encounter for general adult medical examination without abnormal findings: Secondary | ICD-10-CM

## 2016-12-14 DIAGNOSIS — E785 Hyperlipidemia, unspecified: Secondary | ICD-10-CM | POA: Diagnosis not present

## 2016-12-14 MED ORDER — DICYCLOMINE HCL 20 MG PO TABS: 20.0000 mg | ORAL_TABLET | Freq: Four times a day (QID) | ORAL | 2 refills | Status: DC

## 2016-12-14 NOTE — Progress Notes (Signed)
Patient ID: Nicole Nixon, female    DOB: 15-Aug-1946  Age: 71 y.o. MRN: [id number]  The patient is here for annual wellness examination and management of other chronic and acute problems.   Due for 3 d  mammogram and colonoscopy Allergic to eggs Adopted.   The risk factors are reflected in social history   The roster of all physicians providing medical care to patient - is listed in the Snapshot section of the chart.  Activities of daily living:  The patient is 100% independent in all ADLs: dressing, toileting, feeding as well as independent mobility  Home safety : The patient has smoke detectors in the home. They wear seatbelts.  There are no firearms at home. There is no violence in the home.   There is no risks for hepatitis, STDs or HIV. There is no   history of blood transfusion. They have no travel history to infectious disease endemic areas of the world.  The patient has seen their dentist in the last six month. They have seen their eye doctor in the last year. They admit to slight hearing difficulty with regard to whispered voices and some television programs.  They have deferred audiologic testing in the last year.  They do not  have excessive sun exposure. Discussed the need for sun protection: hats, long sleeves and use of sunscreen if there is significant sun exposure.   Diet: the importance of a healthy diet is discussed. They do have a healthy diet.  The benefits of regular aerobic exercise were discussed. She walks 4 times per week ,  20 minutes.   Depression screen: there are no signs or vegative symptoms of depression- irritability, change in appetite, anhedonia, sadness/tearfullness.  Cognitive assessment: the patient manages all their financial and personal affairs and is actively engaged. They could relate day,date,year and events; recalled 2/3 objects at 3 minutes; performed clock-face test normally.  The following portions of the patient's history were reviewed  and updated as appropriate: allergies, current medications, past family history, past medical history,  past surgical history, past social history  and problem list.  Visual acuity was not assessed per patient preference since she has regular follow up with her ophthalmologist. Hearing and body mass index were assessed and reviewed.   During the course of the visit the patient was educated and counseled about appropriate screening and preventive services including : fall prevention , diabetes screening, nutrition counseling, colorectal cancer screening, and recommended immunizations.    CC: The primary encounter diagnosis was Colon cancer screening. Diagnoses of Breast cancer screening, Hyperlipidemia LDL goal <130, Impaired fasting glucose, and Encounter for preventive health examination were also pertinent to this visit.  Discussed cholesterol and impaired fasting glucose Red yeast rice , therapy advised and a1c in 6 weeks.  History Nicole Nixon has a past medical history of Cancer ([place]) (2009) and Glaucoma.   She has a past surgical history that includes Appendectomy (1975); Tonsillectomy and adenoidectomy (1958); Cesarean section (1978); and Dilation and curettage of uterus (1975).   Her She was adopted. Family history is unknown by patient.She reports that she quit smoking about 46 years ago. Her smoking use included Cigarettes. She quit after 4.00 years of use. She has never used smokeless tobacco. She reports that she does not drink alcohol or use drugs.  Outpatient Medications Prior to Visit  Medication Sig Dispense Refill  . loratadine-pseudoephedrine (CLARITIN-D 12-HOUR) 5-120 MG per tablet Take 1 tablet by mouth 2 (two) times daily.    Marland Kitchen  timolol (BETIMOL) 0.25 % ophthalmic solution Apply 1 drop to eye 2 (two) times daily.     Marland Kitchen MAGNESIUM PO Take 1 tablet by mouth.    . zoster vaccine live, PF, (ZOSTAVAX) 16109 UNT/0.65ML injection Inject 19,400 Units into the skin once. (Patient not  taking: Reported on 11/06/2016) 1 each 0  . zoster vaccine live, PF, (ZOSTAVAX) 60454 UNT/0.65ML injection Inject 19,400 Units into the skin once. (Patient not taking: Reported on 11/06/2016) 1 each 0  . dicyclomine (BENTYL) 20 MG tablet Take 1 tablet (20 mg total) by mouth every 6 (six) hours. (Patient not taking: Reported on 12/14/2016) 90 tablet 2   No facility-administered medications prior to visit.     Review of Systems   Patient denies headache, fevers, malaise, unintentional weight loss, skin rash, eye pain, sinus congestion and sinus pain, sore throat, dysphagia,  hemoptysis , cough, dyspnea, wheezing, chest pain, palpitations, orthopnea, edema, abdominal pain, nausea, melena, diarrhea, constipation, flank pain, dysuria, hematuria, urinary  Frequency, nocturia, numbness, tingling, seizures,  Focal weakness, Loss of consciousness,  Tremor, insomnia, depression, anxiety, and suicidal ideation.      Objective:  BP 110/70   Pulse 77   Temp 98.1 F (36.7 C) (Oral)   Resp 16   Ht 5' (1.524 m)   Wt 102 lb 3.2 oz (46.4 kg)   SpO2 94%   BMI 19.96 kg/m   Physical Exam   General appearance: alert, cooperative and appears stated age Head: Normocephalic, without obvious abnormality, atraumatic Eyes: conjunctivae/corneas clear. PERRL, EOM's intact. Fundi benign. Ears: normal TM's and external ear canals both ears Nose: Nares normal. Septum midline. Mucosa normal. No drainage or sinus tenderness. Throat: lips, mucosa, and tongue normal; teeth and gums normal Neck: no adenopathy, no carotid bruit, no JVD, supple, symmetrical, trachea midline and thyroid not enlarged, symmetric, no tenderness/mass/nodules Lungs: clear to auscultation bilaterally Breasts: normal appearance, no masses or tenderness Heart: regular rate and rhythm, S1, S2 normal, no murmur, click, rub or gallop Abdomen: soft, non-tender; bowel sounds normal; no masses,  no organomegaly Extremities: extremities normal,  atraumatic, no cyanosis or edema Pulses: 2+ and symmetric Skin: Skin color, texture, turgor normal. No rashes or lesions Neurologic: Alert and oriented X 3, normal strength and tone. Normal symmetric reflexes. Normal coordination and gait.      Assessment & Plan:   Problem List Items Addressed This Visit    Encounter for preventive health examination    Annual comprehensive preventive exam was done as well as an evaluation and management of chronic conditions .  During the course of the visit the patient was educated and counseled about appropriate screening and preventive services including :  diabetes screening, lipid analysis with projected  10 year  risk for CAD , nutrition counseling, breast, cervical and colorectal cancer screening, and recommended immunizations.  Printed recommendations for health maintenance screenings was given      Hyperlipidemia LDL goal <130    Mild,  10 YR RISK IS 6.7% using the FRC. Recommending trial of red yeast rice and low glycemic index diet   Lab Results  Component Value Date   CHOL 238 (H) 12/07/2016   HDL 64.50 12/07/2016   LDLCALC 152 (H) 12/07/2016   TRIG 106.0 12/07/2016   CHOLHDL 4 12/07/2016         Relevant Orders   Lipid panel   Impaired fasting glucose    She will return in  6 weeks for a1c with hepatic panel given upcoming trial of red  yeast rice.  Low GI diet discussed.   No results found for: HGBA1C       Relevant Orders   Comprehensive metabolic panel   Hemoglobin A1c    Other Visit Diagnoses    Colon cancer screening    -  Primary   Relevant Orders   Ambulatory referral to Gastroenterology   Breast cancer screening       Relevant Orders   MM SCREENING BREAST TOMO BILATERAL      I have changed Ms. Vanderweele's dicyclomine. I am also having her maintain her zoster vaccine live (PF), timolol, zoster vaccine live (PF), loratadine-pseudoephedrine, MAGNESIUM PO, and Azelastine HCl.  Meds ordered this encounter   Medications  . Azelastine HCl 0.15 % SOLN    Sig: Place 1 spray into both nostrils every other day.  . dicyclomine (BENTYL) 20 MG tablet    Sig: Take 1 tablet (20 mg total) by mouth every 6 (six) hours. As needed for IBS    Dispense:  90 tablet    Refill:  2  A total of 40 minutes was spent with patient more than half of which was spent in counseling patient on the above mentioned issues , reviewing and explaining recent labs and imaging studies done, and coordination of care.  Medications Discontinued During This Encounter  Medication Reason  . dicyclomine (BENTYL) 20 MG tablet Reorder    Follow-up: No Follow-up on file.   Crecencio Mc, MD

## 2016-12-14 NOTE — Patient Instructions (Addendum)
Please consider a trial of red Yeast Rice as a natural remedy, along with a low glycemic index diet,  For your borderline elevated cholesterol.  The natural remedies for cholesterol have not been proven to reduce your risk for a heart attack.  Red Yeast Rice has not been proven either,  But it  does lower cholesterol, so if you want to try it , the dose is 600 mg twice daily in capsule form, available OTC.  It does require monitoring of liver enzymes,  Just like the statins,  So if you decide to start it,  I would like you to return in 6 weeks for non fasting labs, and 6 months for fasting labs.   We will also do an a1c to assess your risk for developing type 2 DM   You might want to try a premixed protein drink called Premier Protein shake for breakfast or late night snack . It is great tasting,   very low sugar and available of < $2 serving at [facility] and  In bulk for $1.50/serving at [employer] and [employer]  .    Nutritional analysis :  160 cal  30 g protein  1 g sugar 50% calcium needs   Vladimir Faster and [employer]  Danton Clap now makes a frozen breakfast frittata that can be microwaved in 2 minutes and is very low carb. Frittatas are similar to quiches without the crust.  You can make your own ,  Just google it  Or You tube it for recipes   Below are some of the low carb breads that are available locally :  To make a low carb chip :  Take the Joseph's Lavash or Pita bread,  Or the [place] Low carb whole wheat tortilla   Place on metal cookie sheet  Brush with olive oil  Sprinkle garlic powder (NOT garlic salt), grated parmesan cheese, mediterranean seasoning , or all of them?  Bake at 275 for 30 minutes   We have substitutions for your potatoes!!  Try the mashed cauliflower and riced cauliflower dishes instead of rice and mashed potatoes  Mashed turnips are also very low carb!   For desserts :  Try the Dannon Lt n Fit greek yogurt dessert flavors and top with reddi Whip .  8 carbs,  80  calories  Try Oikos Triple Zero [place] Yogurt in the salted caramel, and the coffee flavors  With Whipped Cream for dessert  breyer's low carb ice cream, available in bars (on a stick, better ) or scoopable ice cream  HERE ARE THE LOW [place] Maintenance for Postmenopausal Women Introduction Menopause is a normal process in which your reproductive ability comes to an end. This process happens gradually over a span of months to years, usually between the ages of 77 and 65. Menopause is complete when you have missed 12 consecutive menstrual periods. It is important to talk with your health care provider about some of the most common conditions that affect postmenopausal women, such as heart disease, cancer, and bone loss (osteoporosis). Adopting a healthy lifestyle and getting preventive care can help to promote your health and wellness. Those actions can also lower your chances of developing some of these common conditions. What should I know about menopause? During menopause, you may experience a number of symptoms, such as:  Moderate-to-severe hot flashes.  Night sweats.  Decrease in sex drive.  Mood swings.  Headaches.  Tiredness.  Irritability.  Memory problems.  Insomnia. Choosing to treat or not to treat menopausal changes is an individual decision that you make with your health care provider. What should I know about hormone replacement therapy and supplements? Hormone therapy products are effective for treating symptoms that are associated with menopause, such as hot flashes and night sweats. Hormone replacement carries certain risks, especially as you become older. If you are thinking about using estrogen or estrogen with progestin treatments, discuss the benefits and risks with your health care provider. What should I know about heart disease and stroke? Heart disease, heart attack, and stroke become more likely as you age. This may be due,  in part, to the hormonal changes that your body experiences during menopause. These can affect how your body processes dietary fats, triglycerides, and cholesterol. Heart attack and stroke are both medical emergencies. There are many things that you can do to help prevent heart disease and stroke:  Have your blood pressure checked at least every 1-2 years. High blood pressure causes heart disease and increases the risk of stroke.  If you are 54-73 years old, ask your health care provider if you should take aspirin to prevent a heart attack or a stroke.  Do not use any tobacco products, including cigarettes, chewing tobacco, or electronic cigarettes. If you need help quitting, ask your health care provider.  It is important to eat a healthy diet and maintain a healthy weight.  Be sure to include plenty of vegetables, fruits, low-fat dairy products, and lean protein.  Avoid eating foods that are high in solid fats, added sugars, or salt (sodium).  Get regular exercise. This is one of the most important things that you can do for your health.  Try to exercise for at least 150 minutes each week. The type of exercise that you do should increase your heart rate and make you sweat. This is known as moderate-intensity exercise.  Try to do strengthening exercises at least twice each week. Do these in addition to the moderate-intensity exercise.  Know your numbers.Ask your health care provider to check your cholesterol and your blood glucose. Continue to have your blood tested as directed by your health care provider. What should I know about cancer screening? There are several types of cancer. Take the following steps to reduce your risk and to catch any cancer development as early as possible. Breast Cancer  Practice breast self-awareness.  This means understanding how your breasts normally appear and feel.  It also means doing regular breast self-exams. Let your health care provider know  about any changes, no matter how small.  If you are 62 or older, have a clinician do a breast exam (clinical breast exam or CBE) every year. Depending on your age, family history, and medical history, it may be recommended that you also have a yearly breast X-ray (mammogram).  If you have a family history of breast cancer, talk with your health care provider about genetic screening.  If you are at high risk for breast cancer, talk with your health care provider about having an MRI and a mammogram every year.  Breast cancer (BRCA) gene test is recommended for women who have family members with BRCA-related cancers. Results of the assessment will determine the need for genetic counseling and BRCA1 and for BRCA2 testing. BRCA-related cancers include these types:  Breast. This occurs in males or females.  Ovarian.  Tubal. This may also be called fallopian tube cancer.  Cancer of the  abdominal or pelvic lining (peritoneal cancer).  Prostate.  Pancreatic. Cervical, Uterine, and Ovarian Cancer  Your health care provider may recommend that you be screened regularly for cancer of the pelvic organs. These include your ovaries, uterus, and vagina. This screening involves a pelvic exam, which includes checking for microscopic changes to the surface of your cervix (Pap test).  For women ages 21-65, health care providers may recommend a pelvic exam and a Pap test every three years. For women ages 22-65, they may recommend the Pap test and pelvic exam, combined with testing for human papilloma virus (HPV), every five years. Some types of HPV increase your risk of cervical cancer. Testing for HPV may also be done on women of any age who have unclear Pap test results.  Other health care providers may not recommend any screening for nonpregnant women who are considered low risk for pelvic cancer and have no symptoms. Ask your health care provider if a screening pelvic exam is right for you.  If you have  had past treatment for cervical cancer or a condition that could lead to cancer, you need Pap tests and screening for cancer for at least 20 years after your treatment. If Pap tests have been discontinued for you, your risk factors (such as having a new sexual partner) need to be reassessed to determine if you should start having screenings again. Some women have medical problems that increase the chance of getting cervical cancer. In these cases, your health care provider may recommend that you have screening and Pap tests more often.  If you have a family history of uterine cancer or ovarian cancer, talk with your health care provider about genetic screening.  If you have vaginal bleeding after reaching menopause, tell your health care provider.  There are currently no reliable tests available to screen for ovarian cancer. Lung Cancer  Lung cancer screening is recommended for adults 3-12 years old who are at high risk for lung cancer because of a history of smoking. A yearly low-dose CT scan of the lungs is recommended if you:  Currently smoke.  Have a history of at least 30 pack-years of smoking and you currently smoke or have quit within the past 15 years. A pack-year is smoking an average of one pack of cigarettes per day for one year. Yearly screening should:  Continue until it has been 15 years since you quit.  Stop if you develop a health problem that would prevent you from having lung cancer treatment. Colorectal Cancer  This type of cancer can be detected and can often be prevented.  Routine colorectal cancer screening usually begins at age 37 and continues through age 48.  If you have risk factors for colon cancer, your health care provider may recommend that you be screened at an earlier age.  If you have a family history of colorectal cancer, talk with your health care provider about genetic screening.  Your health care provider may also recommend using home test kits to  check for hidden blood in your stool.  A small camera at the end of a tube can be used to examine your colon directly (sigmoidoscopy or colonoscopy). This is done to check for the earliest forms of colorectal cancer.  Direct examination of the colon should be repeated every 5-10 years until age 60. However, if early forms of precancerous polyps or small growths are found or if you have a family history or genetic risk for colorectal cancer, you may need to be  screened more often. Skin Cancer  Check your skin from head to toe regularly.  Monitor any moles. Be sure to tell your health care provider:  About any new moles or changes in moles, especially if there is a change in a mole's shape or color.  If you have a mole that is larger than the size of a pencil eraser.  If any of your family members has a history of skin cancer, especially at a young age, talk with your health care provider about genetic screening.  Always use sunscreen. Apply sunscreen liberally and repeatedly throughout the day.  Whenever you are outside, protect yourself by wearing long sleeves, pants, a wide-brimmed hat, and sunglasses. What should I know about osteoporosis? Osteoporosis is a condition in which bone destruction happens more quickly than new bone creation. After menopause, you may be at an increased risk for osteoporosis. To help prevent osteoporosis or the bone fractures that can happen because of osteoporosis, the following is recommended:  If you are 1-59 years old, get at least 1,000 mg of calcium and at least 600 mg of vitamin D per day.  If you are older than age 57 but younger than age 46, get at least 1,200 mg of calcium and at least 600 mg of vitamin D per day.  If you are older than age 57, get at least 1,200 mg of calcium and at least 800 mg of vitamin D per day. Smoking and excessive alcohol intake increase the risk of osteoporosis. Eat foods that are rich in calcium and vitamin D, and do  weight-bearing exercises several times each week as directed by your health care provider. What should I know about how menopause affects my mental health? Depression may occur at any age, but it is more common as you become older. Common symptoms of depression include:  Low or sad mood.  Changes in sleep patterns.  Changes in appetite or eating patterns.  Feeling an overall lack of motivation or enjoyment of activities that you previously enjoyed.  Frequent crying spells. Talk with your health care provider if you think that you are experiencing depression. What should I know about immunizations? It is important that you get and maintain your immunizations. These include:  Tetanus, diphtheria, and pertussis (Tdap) booster vaccine.  Influenza every year before the flu season begins.  Pneumonia vaccine.  Shingles vaccine. Your health care provider may also recommend other immunizations. This information is not intended to replace advice given to you by your health care provider. Make sure you discuss any questions you have with your health care provider. Document Released: 01/11/2006 Document Revised: 06/08/2016 Document Reviewed: 08/23/2015  2017 Elsevier

## 2016-12-14 NOTE — Progress Notes (Signed)
Pre-visit discussion using our clinic review tool. No additional management support is needed unless otherwise documented below in the visit note.  

## 2016-12-16 DIAGNOSIS — R7301 Impaired fasting glucose: Secondary | ICD-10-CM | POA: Insufficient documentation

## 2016-12-16 DIAGNOSIS — E785 Hyperlipidemia, unspecified: Secondary | ICD-10-CM | POA: Insufficient documentation

## 2016-12-16 DIAGNOSIS — Z0001 Encounter for general adult medical examination with abnormal findings: Secondary | ICD-10-CM | POA: Insufficient documentation

## 2016-12-16 DIAGNOSIS — Z0000 Encounter for general adult medical examination without abnormal findings: Secondary | ICD-10-CM | POA: Insufficient documentation

## 2016-12-16 NOTE — Assessment & Plan Note (Signed)
Annual comprehensive preventive exam was done as well as an evaluation and management of chronic conditions .  During the course of the visit the patient was educated and counseled about appropriate screening and preventive services including :  diabetes screening, lipid analysis with projected  10 year  risk for CAD , nutrition counseling, breast, cervical and colorectal cancer screening, and recommended immunizations.  Printed recommendations for health maintenance screenings was given 

## 2016-12-16 NOTE — Assessment & Plan Note (Signed)
She will return in  6 weeks for a1c with hepatic panel given upcoming trial of red yeast rice.  Low GI diet discussed.   No results found for: HGBA1C

## 2016-12-16 NOTE — Assessment & Plan Note (Addendum)
Mild,  10 YR RISK IS 6.7% using the FRC. Recommending trial of red yeast rice and low glycemic index diet   Lab Results  Component Value Date   CHOL 238 (H) 12/07/2016   HDL 64.50 12/07/2016   LDLCALC 152 (H) 12/07/2016   TRIG 106.0 12/07/2016   CHOLHDL 4 12/07/2016

## 2017-01-02 DIAGNOSIS — R0982 Postnasal drip: Secondary | ICD-10-CM | POA: Diagnosis not present

## 2017-01-16 ENCOUNTER — Encounter: Payer: Self-pay | Admitting: Internal Medicine

## 2017-02-13 ENCOUNTER — Ambulatory Visit
Admission: RE | Admit: 2017-02-13 | Discharge: 2017-02-13 | Disposition: A | Discharge status: Home / Self Care | Payer: PPO | Source: Ambulatory Visit | Attending: Internal Medicine | Admitting: Internal Medicine

## 2017-02-13 DIAGNOSIS — Z1231 Encounter for screening mammogram for malignant neoplasm of breast: Secondary | ICD-10-CM | POA: Diagnosis not present

## 2017-02-13 DIAGNOSIS — Z1239 Encounter for other screening for malignant neoplasm of breast: Secondary | ICD-10-CM

## 2017-02-21 ENCOUNTER — Encounter: Payer: Self-pay | Admitting: Internal Medicine

## 2017-03-07 DIAGNOSIS — H401131 Primary open-angle glaucoma, bilateral, mild stage: Secondary | ICD-10-CM | POA: Diagnosis not present

## 2017-03-07 DIAGNOSIS — Z961 Presence of intraocular lens: Secondary | ICD-10-CM | POA: Diagnosis not present

## 2017-04-17 ENCOUNTER — Ambulatory Visit (AMBULATORY_SURGERY_CENTER): Payer: Self-pay

## 2017-04-17 ENCOUNTER — Encounter: Payer: Self-pay | Admitting: Internal Medicine

## 2017-04-17 VITALS — Ht 60.0 in | Wt 98.6 lb

## 2017-04-17 DIAGNOSIS — Z1211 Encounter for screening for malignant neoplasm of colon: Secondary | ICD-10-CM

## 2017-04-17 MED ORDER — SUPREP BOWEL PREP KIT 17.5-3.13-1.6 GM/177ML PO SOLN: 1.0000 | Freq: Once | ORAL | 0 refills | Status: AC

## 2017-04-17 NOTE — Progress Notes (Signed)
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

## 2017-04-25 ENCOUNTER — Telehealth: Payer: PPO | Admitting: Family

## 2017-04-25 DIAGNOSIS — J029 Acute pharyngitis, unspecified: Secondary | ICD-10-CM

## 2017-04-25 MED ORDER — ALBUTEROL SULFATE HFA 108 (90 BASE) MCG/ACT IN AERS
2.0000 | INHALATION_SPRAY | Freq: Four times a day (QID) | RESPIRATORY_TRACT | 2 refills | Status: DC | PRN
Start: 2017-04-25 — End: 2018-02-21

## 2017-04-25 MED ORDER — BENZONATATE 100 MG PO CAPS
100.0000 mg | ORAL_CAPSULE | Freq: Three times a day (TID) | ORAL | 0 refills | Status: DC | PRN
Start: 2017-04-25 — End: 2018-02-21

## 2017-04-25 NOTE — Progress Notes (Signed)
Thank you for the details you put in the comment boxes. Those details really help [place] take better care of you.   We are sorry that you are not feeling well.  Here is how we plan to help!  Based on what you have shared with me it looks like you have upper respiratory tract inflammation that has resulted in a significant cough.  Inflammation and infection in the upper respiratory tract is commonly called bronchitis and has four common causes:  Allergies, Viral Infections, Acid Reflux and Bacterial Infections.  Allergies, viruses and acid reflux are treated by controlling symptoms or eliminating the cause. An example might be a cough caused by taking certain blood pressure medications. You stop the cough by changing the medication. Another example might be a cough caused by acid reflux. Controlling the reflux helps control the cough.  Based on your presentation I believe you most likely have A cough due to a virus.  This is called viral bronchitis and is best treated by rest, plenty of fluids and control of the cough.  You may use Ibuprofen or Tylenol as directed to help your symptoms.     In addition you may use A non-prescription cough medication called Mucinex DM: take 2 tablets every 12 hours. and A prescription cough medication called Tessalon Perles 100mg . You may take 1-2 capsules every 8 hours as needed for your cough.   I have also added an inhaler (albuterol), take 2 puffs every 6 hours as needed for shortness of breath.   USE OF BRONCHODILATOR ("RESCUE") INHALERS: There is a risk from using your bronchodilator too frequently.  The risk is that over-reliance on a medication which only relaxes the muscles surrounding the breathing tubes can reduce the effectiveness of medications prescribed to reduce swelling and congestion of the tubes themselves.  Although you feel brief relief from the bronchodilator inhaler, your asthma may actually be worsening with the tubes becoming more swollen and filled  with mucus.  This can delay other crucial treatments, such as oral steroid medications. If you need to use a bronchodilator inhaler daily, several times per day, you should discuss this with your provider.  There are probably better treatments that could be used to keep your asthma under control.     HOME CARE . Only take medications as instructed by your medical team. . Complete the entire course of an antibiotic. . Drink plenty of fluids and get plenty of rest. . Avoid close contacts especially the very young and the elderly . Cover your mouth if you cough or cough into your sleeve. . Always remember to wash your hands . A steam or ultrasonic humidifier can help congestion.   GET HELP RIGHT AWAY IF: . You develop worsening fever. . You become short of breath . You cough up blood. . Your symptoms persist after you have completed your treatment plan MAKE SURE YOU   Understand these instructions.  Will watch your condition.  Will get help right away if you are not doing well or get worse.  Your e-visit answers were reviewed by a board certified advanced clinical practitioner to complete your personal care plan.  Depending on the condition, your plan could have included both over the counter or prescription medications. If there is a problem please reply  once you have received a response from your provider. Your safety is important to [place].  If you have drug allergies check your prescription carefully.    You can use MyChart to ask questions  about today's visit, request a non-urgent call back, or ask for a work or school excuse for 24 hours related to this e-Visit. If it has been greater than 24 hours you will need to follow up with your provider, or enter a new e-Visit to address those concerns. You will get an e-mail in the next two days asking about your experience.  I hope that your e-visit has been valuable and will speed your recovery. Thank you for using e-visits.

## 2017-05-01 ENCOUNTER — Ambulatory Visit (AMBULATORY_SURGERY_CENTER): Payer: PPO | Admitting: Internal Medicine

## 2017-05-01 ENCOUNTER — Encounter: Payer: Self-pay | Admitting: Internal Medicine

## 2017-05-01 VITALS — BP 105/56 | HR 58 | Temp 97.8°F | Resp 16 | Ht 60.0 in | Wt 98.0 lb

## 2017-05-01 DIAGNOSIS — Z1211 Encounter for screening for malignant neoplasm of colon: Secondary | ICD-10-CM

## 2017-05-01 DIAGNOSIS — Z1212 Encounter for screening for malignant neoplasm of rectum: Secondary | ICD-10-CM | POA: Diagnosis not present

## 2017-05-01 MED ORDER — SODIUM CHLORIDE 0.9 % IV SOLN: 500.0000 mL | INTRAVENOUS | Status: AC

## 2017-05-01 NOTE — Patient Instructions (Signed)
YOU HAD AN ENDOSCOPIC PROCEDURE TODAY AT [facility]:   Refer to the procedure report that was given to you for any specific questions about what was found during the examination.  If the procedure report does not answer your questions, please call your gastroenterologist to clarify.  If you requested that your care partner not be given the details of your procedure findings, then the procedure report has been included in a sealed envelope for you to review at your convenience later.  YOU SHOULD EXPECT: Some feelings of bloating in the abdomen. Passage of more gas than usual.  Walking can help get rid of the air that was put into your GI tract during the procedure and reduce the bloating. If you had a lower endoscopy (such as a colonoscopy or flexible sigmoidoscopy) you may notice spotting of blood in your stool or on the toilet paper. If you underwent a bowel prep for your procedure, you may not have a normal bowel movement for a few days.  Please Note:  You might notice some irritation and congestion in your nose or some drainage.  This is from the oxygen used during your procedure.  There is no need for concern and it should clear up in a day or so.  SYMPTOMS TO REPORT IMMEDIATELY:   Following lower endoscopy (colonoscopy or flexible sigmoidoscopy):  Excessive amounts of blood in the stool  Significant tenderness or worsening of abdominal pains  Swelling of the abdomen that is new, acute  Fever of 100F or higher    For urgent or emergent issues, a gastroenterologist can be reached at any hour by calling [phone].   DIET:  We do recommend a small meal at first, but then you may proceed to your regular diet.  Drink plenty of fluids but you should avoid alcoholic beverages for 24 hours.  ACTIVITY:  You should plan to take it easy for the rest of today and you should NOT DRIVE or use heavy machinery until tomorrow (because of the sedation medicines used during the test).     FOLLOW UP: Our staff will call the number listed on your records the next business day following your procedure to check on you and address any questions or concerns that you may have regarding the information given to you following your procedure. If we do not reach you, we will leave a message.  However, if you are feeling well and you are not experiencing any problems, there is no need to return our call.  We will assume that you have returned to your regular daily activities without incident.  If any biopsies were taken you will be contacted by phone or by letter within the next 1-3 weeks.  Please call us at [phone] if you have not heard about the biopsies in 3 weeks.    SIGNATURES/CONFIDENTIALITY: You and/or your care partner have signed paperwork which will be entered into your electronic medical record.  These signatures attest to the fact that that the information above on your After Visit Summary has been reviewed and is understood.  Full responsibility of the confidentiality of this discharge information lies with you and/or your care-partner.    Diverticulosis, and high fiber diet information given  Repeat colonoscopy in 10 years-2028.

## 2017-05-01 NOTE — Progress Notes (Signed)
Pt's states no medical or surgical changes since previsit or office visit. 

## 2017-05-01 NOTE — Progress Notes (Signed)
Report to PACU, RN, vss, BBS= Clear.  

## 2017-05-01 NOTE — Op Note (Signed)
[facility] Patient Name: Nicole Nixon Procedure Date: 05/01/2017 10:46 AM MRN: [id number] Endoscopist: Jerene Bears , MD Age: 71 Referring MD:  Date of Birth: 1946-10-13 Gender: Female Account #: [id number] Procedure:                Colonoscopy Indications:              Screening for colorectal malignant neoplasm, Last                            colonoscopy 10 years ago Medicines:                Monitored Anesthesia Care Procedure:                Pre-Anesthesia Assessment:                           - Prior to the procedure, a History and Physical                            was performed, and patient medications and                            allergies were reviewed. The patient's tolerance of                            previous anesthesia was also reviewed. The risks                            and benefits of the procedure and the sedation                            options and risks were discussed with the patient.                            All questions were answered, and informed consent                            was obtained. Prior Anticoagulants: The patient has                            taken no previous anticoagulant or antiplatelet                            agents. ASA Grade Assessment: II - A patient with                            mild systemic disease. After reviewing the risks                            and benefits, the patient was deemed in                            satisfactory condition to undergo the procedure.  After obtaining informed consent, the colonoscope                            was passed under direct vision. Throughout the                            procedure, the patient's blood pressure, pulse, and                            oxygen saturations were monitored continuously. The                            Colonoscope was introduced through the anus and                            advanced to the the terminal  ileum. The colonoscopy                            was performed without difficulty. The patient                            tolerated the procedure well. The quality of the                            bowel preparation was good. The terminal ileum,                            ileocecal valve, appendiceal orifice, and rectum                            were photographed. Scope In: 11:03:48 AM Scope Out: 11:18:59 AM Scope Withdrawal Time: 0 hours 8 minutes 46 seconds  Total Procedure Duration: 0 hours 15 minutes 11 seconds  Findings:                 The digital rectal exam was normal.                           The terminal ileum appeared normal.                           A few small-mouthed diverticula were found in the                            sigmoid colon.                           The exam was otherwise without abnormality.                            Retroflexion not performed in the rectum due to                            narrow rectal vault. Adequate views of the distal  rectum/anal canal on forward view. Complications:            No immediate complications. Estimated Blood Loss:     Estimated blood loss: none. Impression:               - The examined portion of the ileum was normal.                           - Diverticulosis in the sigmoid colon.                           - The examination was otherwise normal.                           - No specimens collected. Recommendation:           - Patient has a contact number available for                            emergencies. The signs and symptoms of potential                            delayed complications were discussed with the                            patient. Return to normal activities tomorrow.                            Written discharge instructions were provided to the                            patient.                           - Resume previous diet.                           - Continue present  medications.                           - Await pathology results.                           - Repeat colonoscopy in 10 years for screening                            purposes. Jerene Bears, MD 05/01/2017 11:22:11 AM This report has been signed electronically.

## 2017-05-02 ENCOUNTER — Telehealth: Payer: Self-pay

## 2017-05-02 NOTE — Telephone Encounter (Signed)
  Follow up Call-  Call back number 05/01/2017  Post procedure Call Back phone  # [phone]  Permission to leave phone message Yes  Some recent data might be hidden     Patient questions:  Do you have a fever, pain , or abdominal swelling? No. Pain Score  0 *  Have you tolerated food without any problems? Yes.    Have you been able to return to your normal activities? Yes.    Do you have any questions about your discharge instructions: Diet   No. Medications  No. Follow up visit  No.  Do you have questions or concerns about your Care? No.  Actions: * If pain score is 4 or above: No action needed, pain <4.

## 2017-09-30 DIAGNOSIS — H401191 Primary open-angle glaucoma, unspecified eye, mild stage: Secondary | ICD-10-CM | POA: Diagnosis not present

## 2017-11-06 ENCOUNTER — Ambulatory Visit: Payer: PPO

## 2017-12-18 ENCOUNTER — Encounter: Payer: PPO | Admitting: Internal Medicine

## 2017-12-18 DIAGNOSIS — L820 Inflamed seborrheic keratosis: Secondary | ICD-10-CM | POA: Diagnosis not present

## 2017-12-18 DIAGNOSIS — Z08 Encounter for follow-up examination after completed treatment for malignant neoplasm: Secondary | ICD-10-CM | POA: Diagnosis not present

## 2017-12-18 DIAGNOSIS — C44712 Basal cell carcinoma of skin of right lower limb, including hip: Secondary | ICD-10-CM | POA: Diagnosis not present

## 2017-12-18 DIAGNOSIS — C44319 Basal cell carcinoma of skin of other parts of face: Secondary | ICD-10-CM | POA: Diagnosis not present

## 2017-12-18 DIAGNOSIS — L538 Other specified erythematous conditions: Secondary | ICD-10-CM | POA: Diagnosis not present

## 2017-12-18 DIAGNOSIS — D485 Neoplasm of uncertain behavior of skin: Secondary | ICD-10-CM | POA: Diagnosis not present

## 2017-12-18 DIAGNOSIS — D1801 Hemangioma of skin and subcutaneous tissue: Secondary | ICD-10-CM | POA: Diagnosis not present

## 2017-12-18 DIAGNOSIS — Z85828 Personal history of other malignant neoplasm of skin: Secondary | ICD-10-CM | POA: Diagnosis not present

## 2018-01-08 DIAGNOSIS — Z85828 Personal history of other malignant neoplasm of skin: Secondary | ICD-10-CM | POA: Diagnosis not present

## 2018-01-08 DIAGNOSIS — C44319 Basal cell carcinoma of skin of other parts of face: Secondary | ICD-10-CM | POA: Diagnosis not present

## 2018-01-27 DIAGNOSIS — C44712 Basal cell carcinoma of skin of right lower limb, including hip: Secondary | ICD-10-CM | POA: Diagnosis not present

## 2018-02-21 ENCOUNTER — Ambulatory Visit (INDEPENDENT_AMBULATORY_CARE_PROVIDER_SITE_OTHER): Payer: PPO | Admitting: Internal Medicine

## 2018-02-21 ENCOUNTER — Encounter: Payer: Self-pay | Admitting: Internal Medicine

## 2018-02-21 VITALS — BP 104/62 | HR 63 | Temp 98.2°F | Resp 15 | Ht 60.0 in | Wt 101.6 lb

## 2018-02-21 DIAGNOSIS — E785 Hyperlipidemia, unspecified: Secondary | ICD-10-CM | POA: Diagnosis not present

## 2018-02-21 DIAGNOSIS — Z13220 Encounter for screening for lipoid disorders: Secondary | ICD-10-CM | POA: Diagnosis not present

## 2018-02-21 DIAGNOSIS — R5383 Other fatigue: Secondary | ICD-10-CM | POA: Diagnosis not present

## 2018-02-21 DIAGNOSIS — E559 Vitamin D deficiency, unspecified: Secondary | ICD-10-CM

## 2018-02-21 DIAGNOSIS — M810 Age-related osteoporosis without current pathological fracture: Secondary | ICD-10-CM | POA: Diagnosis not present

## 2018-02-21 DIAGNOSIS — R7301 Impaired fasting glucose: Secondary | ICD-10-CM | POA: Diagnosis not present

## 2018-02-21 DIAGNOSIS — Z1231 Encounter for screening mammogram for malignant neoplasm of breast: Secondary | ICD-10-CM

## 2018-02-21 DIAGNOSIS — Z1239 Encounter for other screening for malignant neoplasm of breast: Secondary | ICD-10-CM

## 2018-02-21 DIAGNOSIS — Z0000 Encounter for general adult medical examination without abnormal findings: Secondary | ICD-10-CM

## 2018-02-21 LAB — COMPREHENSIVE METABOLIC PANEL
ALT: 15 U/L (ref 0–35)
AST: 19 U/L (ref 0–37)
Albumin: 4.2 g/dL (ref 3.5–5.2)
Alkaline Phosphatase: 84 U/L (ref 39–117)
BILIRUBIN TOTAL: 0.5 mg/dL (ref 0.2–1.2)
BUN: 23 mg/dL (ref 6–23)
CO2: 31 meq/L (ref 19–32)
CREATININE: 0.78 mg/dL (ref 0.40–1.20)
Calcium: 9.4 mg/dL (ref 8.4–10.5)
Chloride: 103 mEq/L (ref 96–112)
GFR: 77.55 mL/min (ref >60.00)
GLUCOSE: 99 mg/dL (ref 70–99)
Potassium: 3.8 mEq/L (ref 3.5–5.1)
Sodium: 142 mEq/L (ref 135–145)
Total Protein: 7.4 g/dL (ref 6.0–8.3)

## 2018-02-21 LAB — CBC WITH DIFFERENTIAL/PLATELET
BASOS ABS: 0 10*3/uL (ref 0.0–0.1)
Basophils Relative: 0.4 % (ref 0.0–3.0)
Eosinophils Absolute: 0.1 10*3/uL (ref 0.0–0.7)
Eosinophils Relative: 1.4 % (ref 0.0–5.0)
HCT: 42.6 % (ref 36.0–46.0)
Hemoglobin: 14.3 g/dL (ref 12.0–15.0)
LYMPHS ABS: 1.7 10*3/uL (ref 0.7–4.0)
LYMPHS PCT: 30.9 % (ref 12.0–46.0)
MCHC: 33.5 g/dL (ref 30.0–36.0)
MCV: 93.5 fl (ref 78.0–100.0)
Monocytes Absolute: 0.6 10*3/uL (ref 0.1–1.0)
Monocytes Relative: 9.9 % (ref 3.0–12.0)
NEUTROS PCT: 57.4 % (ref 43.0–77.0)
Neutro Abs: 3.2 10*3/uL (ref 1.4–7.7)
Platelets: 233 10*3/uL (ref 150.0–400.0)
RBC: 4.56 Mil/uL (ref 3.87–5.11)
RDW: 13.2 % (ref 11.5–15.5)
WBC: 5.6 10*3/uL (ref 4.0–10.5)

## 2018-02-21 LAB — LIPID PANEL
CHOL/HDL RATIO: 3
Cholesterol: 211 mg/dL — ABNORMAL HIGH (ref 0–200)
HDL: 62.8 mg/dL (ref >39.00)
LDL Cholesterol: 134 mg/dL — ABNORMAL HIGH (ref 0–99)
NONHDL: 147.94
Triglycerides: 72 mg/dL (ref 0.0–149.0)
VLDL: 14.4 mg/dL (ref 0.0–40.0)

## 2018-02-21 LAB — VITAMIN D 25 HYDROXY (VIT D DEFICIENCY, FRACTURES): VITD: 46.19 ng/mL (ref 30.00–100.00)

## 2018-02-21 LAB — TSH: TSH: 1.28 u[IU]/mL (ref 0.35–4.50)

## 2018-02-21 NOTE — Progress Notes (Signed)
Patient ID: Nicole Nixon, female    DOB: 07-14-1946  Age: 72 y.o. MRN: [id number]  The patient is here for follow up and management of other chronic and acute problems.  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 Can wrinkle forehead.   DEXA 2016 osteoporosis  Mammogram march 2018  Colonoscopy 2018   Had a dental implant lower jaw took  A total of 9 months !!!     The risk factors are reflected in the social history.  The roster of all physicians providing medical care to patient - is listed in the Snapshot section of the chart.  Activities of daily living:  The patient is 100% independent in all ADLs: dressing, toileting, feeding as well as independent mobility  Home safety : The patient has smoke detectors in the home. They wear seatbelts.  There are no firearms at home. There is no violence in the home.   There is no risks for hepatitis, STDs or  HIV. There is no   history of blood transfusion. They have no travel history to infectious disease endemic areas of the world.  The patient has seen their dentist in the last six month. They have seen their eye doctor every 6 months for management of glaucoma. They admit to slight hearing difficulty with regard to whispered voices and some television programs.  They have deferred audiologic testing in the last year but would liek to have it done this year at dr Mcqueen's office  They do not  have excessive sun exposure. Discussed the need for sun protection: hats, long sleeves and use of sunscreen if there is significant sun exposure.   Diet: the importance of a healthy diet is discussed. They do have a healthy diet.  The benefits of regular aerobic exercise were discussed. She walks 4 times per week ,  35 minutes.   Depression screen: there are no signs or vegative symptoms of depression- irritability, change in appetite, anhedonia, sadness/tearfullness.  Cognitive  assessment: the patient manages all their financial and personal affairs and is actively engaged. They could relate day,date,year and events; recalled 2/3 objects at 3 minutes; performed clock-face test normally.  The following portions of the patient's history were reviewed and updated as appropriate: allergies, current medications, past family history, past medical history,  past surgical history, past social history  and problem list.  Visual acuity was not assessed per patient preference since she has regular follow up with her ophthalmologist. Hearing and body mass index were assessed and reviewed.   During the course of the visit the patient was educated and counseled about appropriate screening and preventive services including : fall prevention , diabetes screening, nutrition counseling, colorectal cancer screening, and recommended immunizations.    CC: The primary encounter diagnosis was Vitamin D deficiency. Diagnoses of Breast cancer screening, Fatigue, unspecified type, Screening for hyperlipidemia, Hyperlipidemia LDL goal <100, Encounter for preventive health examination, Hyperlipidemia LDL goal <130, Impaired fasting glucose, and Osteoporosis, post-menopausal were also pertinent to this visit.  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 Denies pain and muscle paralysis . Demonstrates that she Can wrinkle forehead.   Had a dental implant lto replace a broken tooth in her lower jaw took.  The entire process took a total of 9 months !!!     History Nicole Nixon has a past medical  history of Allergy, Cancer ([place]) (2009), Cataract, Glaucoma, and Osteoporosis.   She has a past surgical history that includes Appendectomy (1975); Tonsillectomy and adenoidectomy (1958); Cesarean section (1978); Dilation and curettage of uterus (1975); and Cataract extraction, bilateral (2016).   Her family history is not on file. She  was adopted.She reports that she quit smoking about 47 years ago. Her smoking use included cigarettes. She quit after 4.00 years of use. She has never used smokeless tobacco. She reports that she does not drink alcohol or use drugs.  Outpatient Medications Prior to Visit  Medication Sig Dispense Refill  . timolol (BETIMOL) 0.25 % ophthalmic solution Apply 1 drop to eye 2 (two) times daily.     . Azelastine HCl 0.15 % SOLN Place 1 spray into both nostrils every other day.    . loratadine-pseudoephedrine (CLARITIN-D 12-HOUR) 5-120 MG per tablet Take 1 tablet by mouth 2 (two) times daily.    Marland Kitchen albuterol (PROVENTIL HFA;VENTOLIN HFA) 108 (90 Base) MCG/ACT inhaler Inhale 2 puffs into the lungs every 6 (six) hours as needed for wheezing or shortness of breath. (Patient not taking: Reported on 02/21/2018) 1 Inhaler 2  . benzonatate (TESSALON PERLES) 100 MG capsule Take 1-2 capsules (100-200 mg total) by mouth every 8 (eight) hours as needed. (Patient not taking: Reported on 02/21/2018) 30 capsule 0  . dicyclomine (BENTYL) 20 MG tablet Take 1 tablet (20 mg total) by mouth every 6 (six) hours. As needed for IBS (Patient not taking: Reported on 02/21/2018) 90 tablet 2  . MAGNESIUM PO Take 1 tablet by mouth.     Facility-Administered Medications Prior to Visit  Medication Dose Route Frequency Provider Last Rate Last Dose  . 0.9 %  sodium chloride infusion  500 mL Intravenous Continuous Pyrtle, Lajuan Lines, MD        Review of Systems   Patient denies headache, fevers, malaise, unintentional weight loss, skin rash, eye pain, sinus congestion and sinus pain, sore throat, dysphagia,  hemoptysis , cough, dyspnea, wheezing, chest pain, palpitations, orthopnea, edema, abdominal pain, nausea, melena, diarrhea, constipation, flank pain, dysuria, hematuria, urinary  Frequency, nocturia, numbness, tingling, seizures,  Focal weakness, Loss of consciousness,  Tremor, insomnia, depression, anxiety, and suicidal ideation.       Objective:  BP 104/62 (BP Location: Left Arm, Patient Position: Sitting, Cuff Size: Normal)   Pulse 63   Temp 98.2 F (36.8 C) (Oral)   Resp 15   Ht 5' (1.524 m)   Wt 101 lb 9.6 oz (46.1 kg)   SpO2 97%   BMI 19.84 kg/m   Physical Exam   General appearance: alert, cooperative and appears stated age Head: Normocephalic, without obvious abnormality, atraumatic Eyes: conjunctivae/corneas clear. PERRL, EOM's intact. Fundi benign. Ears: normal TM's and external ear canals both ears Nose: Nares normal. Septum midline. Mucosa normal. No drainage or sinus tenderness. Throat: lips, mucosa, and tongue normal; teeth and gums normal Neck: no adenopathy, no carotid bruit, no JVD, supple, symmetrical, trachea midline and thyroid not enlarged, symmetric, no tenderness/mass/nodules Lungs: clear to auscultation bilaterally Breasts: normal appearance, no masses or tenderness Heart: regular rate and rhythm, S1, S2 normal, no murmur, click, rub or gallop Abdomen: soft, non-tender; bowel sounds normal; no masses,  no organomegaly Extremities: extremities normal, atraumatic, no cyanosis or edema Pulses: 2+ and symmetric Skin: well healed surgical scar ,  Left forehead  Skin color, texture, turgor normal. No rashes or lesions Neurologic: Alert and oriented X 3, cranial nerves intact bilaterally. normal strength and tone.  Normal symmetric reflexes. Normal coordination and gait.      Assessment & Plan:   Problem List Items Addressed This Visit    Vitamin D deficiency - Primary   Relevant Orders   VITAMIN D 25 Hydroxy (Vit-D Deficiency, Fractures) (Completed)   Osteoporosis, post-menopausal    By prior DEXA ,with lack of response to prior trials of bisphosphonates , SERMS and PTH. She is not interested in pursuing treatment with Prolia.         RESOLVED: Impaired fasting glucose           Hyperlipidemia LDL goal <130    Mild,  10 YR RISK IS 5%   using the FRC. No therapy recommended  other than Mediterranean style diet t   Lab Results  Component Value Date   CHOL 211 (H) 02/21/2018   HDL 62.80 02/21/2018   LDLCALC 134 (H) 02/21/2018   TRIG 72.0 02/21/2018   CHOLHDL 3 02/21/2018         Encounter for preventive health examination    Annual comprehensive preventive exam was done as well as an evaluation and management of chronic conditions .  During the course of the visit the patient was educated and counseled about appropriate screening and preventive services including :  diabetes screening, lipid analysis with projected  10 year  risk for CAD , nutrition counseling, breast, cervical and colorectal cancer screening, and recommended immunizations.  Printed recommendations for health maintenance screenings was give       Other Visit Diagnoses    Breast cancer screening       Relevant Orders   MM SCREENING BREAST TOMO BILATERAL   Fatigue, unspecified type       Relevant Orders   Comprehensive metabolic panel (Completed)   CBC with Differential/Platelet (Completed)   TSH (Completed)   Screening for hyperlipidemia       Hyperlipidemia LDL goal <100       Relevant Orders   Lipid panel (Completed)      I have discontinued Osa M. Coote's MAGNESIUM PO, dicyclomine, benzonatate, and albuterol. I am also having her maintain her timolol, loratadine-pseudoephedrine, and Azelastine HCl. We will continue to administer sodium chloride.  No orders of the defined types were placed in this encounter.  A total of 40 minutes was spent with patient more than half of which was spent in counseling patient on the above mentioned issues , reviewing and explaining recent labs and imaging studies done, and coordination of care.  Medications Discontinued During This Encounter  Medication Reason  . albuterol (PROVENTIL HFA;VENTOLIN HFA) 108 (90 Base) MCG/ACT inhaler Patient has not taken in last 30 days  . benzonatate (TESSALON PERLES) 100 MG capsule Patient has not taken in  last 30 days  . MAGNESIUM PO Patient has not taken in last 30 days  . dicyclomine (BENTYL) 20 MG tablet     Follow-up: No follow-ups on file.   Crecencio Mc, MD

## 2018-02-21 NOTE — Patient Instructions (Addendum)
Your 3 D mammogram has been ordered  To prevent anouther Spring sinus infection  You should try using  NeilMed's Sinus rinse daily  ;  It is a stong sinus "flush" using water and medicated salts.  Do it over the sink because it can be a bit messy  Start your azelastine nasal spray soon!   Health Maintenance for Postmenopausal Women Menopause is a normal process in which your reproductive ability comes to an end. This process happens gradually over a span of months to years, usually between the ages of 70 and 73. Menopause is complete when you have missed 12 consecutive menstrual periods. It is important to talk with your health care provider about some of the most common conditions that affect postmenopausal women, such as heart disease, cancer, and bone loss (osteoporosis). Adopting a healthy lifestyle and getting preventive care can help to promote your health and wellness. Those actions can also lower your chances of developing some of these common conditions. What should I know about menopause? During menopause, you may experience a number of symptoms, such as:  Moderate-to-severe hot flashes.  Night sweats.  Decrease in sex drive.  Mood swings.  Headaches.  Tiredness.  Irritability.  Memory problems.  Insomnia.  Choosing to treat or not to treat menopausal changes is an individual decision that you make with your health care provider. What should I know about hormone replacement therapy and supplements? Hormone therapy products are effective for treating symptoms that are associated with menopause, such as hot flashes and night sweats. Hormone replacement carries certain risks, especially as you become older. If you are thinking about using estrogen or estrogen with progestin treatments, discuss the benefits and risks with your health care provider. What should I know about heart disease and stroke? Heart disease, heart attack, and stroke become more likely as you age. This  may be due, in part, to the hormonal changes that your body experiences during menopause. These can affect how your body processes dietary fats, triglycerides, and cholesterol. Heart attack and stroke are both medical emergencies. There are many things that you can do to help prevent heart disease and stroke:  Have your blood pressure checked at least every 1-2 years. High blood pressure causes heart disease and increases the risk of stroke.  If you are 11-70 years old, ask your health care provider if you should take aspirin to prevent a heart attack or a stroke.  Do not use any tobacco products, including cigarettes, chewing tobacco, or electronic cigarettes. If you need help quitting, ask your health care provider.  It is important to eat a healthy diet and maintain a healthy weight. ? Be sure to include plenty of vegetables, fruits, low-fat dairy products, and lean protein. ? Avoid eating foods that are high in solid fats, added sugars, or salt (sodium).  Get regular exercise. This is one of the most important things that you can do for your health. ? Try to exercise for at least 150 minutes each week. The type of exercise that you do should increase your heart rate and make you sweat. This is known as moderate-intensity exercise. ? Try to do strengthening exercises at least twice each week. Do these in addition to the moderate-intensity exercise.  Know your numbers.Ask your health care provider to check your cholesterol and your blood glucose. Continue to have your blood tested as directed by your health care provider.  What should I know about cancer screening? There are several types of cancer. Take  the following steps to reduce your risk and to catch any cancer development as early as possible. Breast Cancer  Practice breast self-awareness. ? This means understanding how your breasts normally appear and feel. ? It also means doing regular breast self-exams. Let your health care  provider know about any changes, no matter how small.  If you are 63 or older, have a clinician do a breast exam (clinical breast exam or CBE) every year. Depending on your age, family history, and medical history, it may be recommended that you also have a yearly breast X-ray (mammogram).  If you have a family history of breast cancer, talk with your health care provider about genetic screening.  If you are at high risk for breast cancer, talk with your health care provider about having an MRI and a mammogram every year.  Breast cancer (BRCA) gene test is recommended for women who have family members with BRCA-related cancers. Results of the assessment will determine the need for genetic counseling and BRCA1 and for BRCA2 testing. BRCA-related cancers include these types: ? Breast. This occurs in males or females. ? Ovarian. ? Tubal. This may also be called fallopian tube cancer. ? Cancer of the abdominal or pelvic lining (peritoneal cancer). ? Prostate. ? Pancreatic.  Cervical, Uterine, and Ovarian Cancer Your health care provider may recommend that you be screened regularly for cancer of the pelvic organs. These include your ovaries, uterus, and vagina. This screening involves a pelvic exam, which includes checking for microscopic changes to the surface of your cervix (Pap test).  For women ages 21-65, health care providers may recommend a pelvic exam and a Pap test every three years. For women ages 39-65, they may recommend the Pap test and pelvic exam, combined with testing for human papilloma virus (HPV), every five years. Some types of HPV increase your risk of cervical cancer. Testing for HPV may also be done on women of any age who have unclear Pap test results.  Other health care providers may not recommend any screening for nonpregnant women who are considered low risk for pelvic cancer and have no symptoms. Ask your health care provider if a screening pelvic exam is right for  you.  If you have had past treatment for cervical cancer or a condition that could lead to cancer, you need Pap tests and screening for cancer for at least 20 years after your treatment. If Pap tests have been discontinued for you, your risk factors (such as having a new sexual partner) need to be reassessed to determine if you should start having screenings again. Some women have medical problems that increase the chance of getting cervical cancer. In these cases, your health care provider may recommend that you have screening and Pap tests more often.  If you have a family history of uterine cancer or ovarian cancer, talk with your health care provider about genetic screening.  If you have vaginal bleeding after reaching menopause, tell your health care provider.  There are currently no reliable tests available to screen for ovarian cancer.  Lung Cancer Lung cancer screening is recommended for adults 9-24 years old who are at high risk for lung cancer because of a history of smoking. A yearly low-dose CT scan of the lungs is recommended if you:  Currently smoke.  Have a history of at least 30 pack-years of smoking and you currently smoke or have quit within the past 15 years. A pack-year is smoking an average of one pack of cigarettes per  day for one year.  Yearly screening should:  Continue until it has been 15 years since you quit.  Stop if you develop a health problem that would prevent you from having lung cancer treatment.  Colorectal Cancer  This type of cancer can be detected and can often be prevented.  Routine colorectal cancer screening usually begins at age 56 and continues through age 34.  If you have risk factors for colon cancer, your health care provider may recommend that you be screened at an earlier age.  If you have a family history of colorectal cancer, talk with your health care provider about genetic screening.  Your health care provider may also recommend  using home test kits to check for hidden blood in your stool.  A small camera at the end of a tube can be used to examine your colon directly (sigmoidoscopy or colonoscopy). This is done to check for the earliest forms of colorectal cancer.  Direct examination of the colon should be repeated every 5-10 years until age 66. However, if early forms of precancerous polyps or small growths are found or if you have a family history or genetic risk for colorectal cancer, you may need to be screened more often.  Skin Cancer  Check your skin from head to toe regularly.  Monitor any moles. Be sure to tell your health care provider: ? About any new moles or changes in moles, especially if there is a change in a mole's shape or color. ? If you have a mole that is larger than the size of a pencil eraser.  If any of your family members has a history of skin cancer, especially at a young age, talk with your health care provider about genetic screening.  Always use sunscreen. Apply sunscreen liberally and repeatedly throughout the day.  Whenever you are outside, protect yourself by wearing long sleeves, pants, a wide-brimmed hat, and sunglasses.  What should I know about osteoporosis? Osteoporosis is a condition in which bone destruction happens more quickly than new bone creation. After menopause, you may be at an increased risk for osteoporosis. To help prevent osteoporosis or the bone fractures that can happen because of osteoporosis, the following is recommended:  If you are 36-50 years old, get at least 1,000 mg of calcium and at least 600 mg of vitamin D per day.  If you are older than age 65 but younger than age 58, get at least 1,200 mg of calcium and at least 600 mg of vitamin D per day.  If you are older than age 45, get at least 1,200 mg of calcium and at least 800 mg of vitamin D per day.  Smoking and excessive alcohol intake increase the risk of osteoporosis. Eat foods that are rich in  calcium and vitamin D, and do weight-bearing exercises several times each week as directed by your health care provider. What should I know about how menopause affects my mental health? Depression may occur at any age, but it is more common as you become older. Common symptoms of depression include:  Low or sad mood.  Changes in sleep patterns.  Changes in appetite or eating patterns.  Feeling an overall lack of motivation or enjoyment of activities that you previously enjoyed.  Frequent crying spells.  Talk with your health care provider if you think that you are experiencing depression. What should I know about immunizations? It is important that you get and maintain your immunizations. These include:  Tetanus, diphtheria, and pertussis (  Tdap) booster vaccine.  Influenza every year before the flu season begins.  Pneumonia vaccine.  Shingles vaccine.  Your health care provider may also recommend other immunizations. This information is not intended to replace advice given to you by your health care provider. Make sure you discuss any questions you have with your health care provider. Document Released: 01/11/2006 Document Revised: 06/08/2016 Document Reviewed: 08/23/2015 Elsevier Interactive Patient Education  2018 [employer].

## 2018-02-22 NOTE — Assessment & Plan Note (Signed)
Annual comprehensive preventive exam was done as well as an evaluation and management of chronic conditions .  During the course of the visit the patient was educated and counseled about appropriate screening and preventive services including :  diabetes screening, lipid analysis with projected  10 year  risk for CAD , nutrition counseling, breast, cervical and colorectal cancer screening, and recommended immunizations.  Printed recommendations for health maintenance screenings was give 

## 2018-02-22 NOTE — Assessment & Plan Note (Signed)
Mild,  10 YR RISK IS 5%   using the FRC. No therapy recommended other than Mediterranean style diet t   Lab Results  Component Value Date   CHOL 211 (H) 02/21/2018   HDL 62.80 02/21/2018   LDLCALC 134 (H) 02/21/2018   TRIG 72.0 02/21/2018   CHOLHDL 3 02/21/2018

## 2018-02-22 NOTE — Assessment & Plan Note (Signed)
By prior DEXA ,with lack of response to prior trials of bisphosphonates , SERMS and PTH. She is not interested in pursuing treatment with Prolia.    

## 2018-04-02 DIAGNOSIS — M9903 Segmental and somatic dysfunction of lumbar region: Secondary | ICD-10-CM | POA: Diagnosis not present

## 2018-04-02 DIAGNOSIS — M5442 Lumbago with sciatica, left side: Secondary | ICD-10-CM | POA: Diagnosis not present

## 2018-04-07 DIAGNOSIS — H401131 Primary open-angle glaucoma, bilateral, mild stage: Secondary | ICD-10-CM | POA: Diagnosis not present

## 2018-04-11 ENCOUNTER — Ambulatory Visit
Admission: RE | Admit: 2018-04-11 | Discharge: 2018-04-11 | Disposition: A | Discharge status: Home / Self Care | Payer: PPO | Source: Ambulatory Visit | Attending: Internal Medicine | Admitting: Internal Medicine

## 2018-04-11 DIAGNOSIS — Z1239 Encounter for other screening for malignant neoplasm of breast: Secondary | ICD-10-CM

## 2018-04-11 DIAGNOSIS — Z1231 Encounter for screening mammogram for malignant neoplasm of breast: Secondary | ICD-10-CM | POA: Diagnosis not present

## 2018-05-15 IMAGING — MG MM DIGITAL SCREENING BILAT W/ TOMO W/ CAD
6 of 10 series · 6 of 30 positions shown · non-contrast
Comparison: Previous exam(s).

CLINICAL DATA: Screening.

EXAM:
DIGITAL SCREENING BILATERAL MAMMOGRAM WITH TOMO AND CAD

[L CC synth-2D]
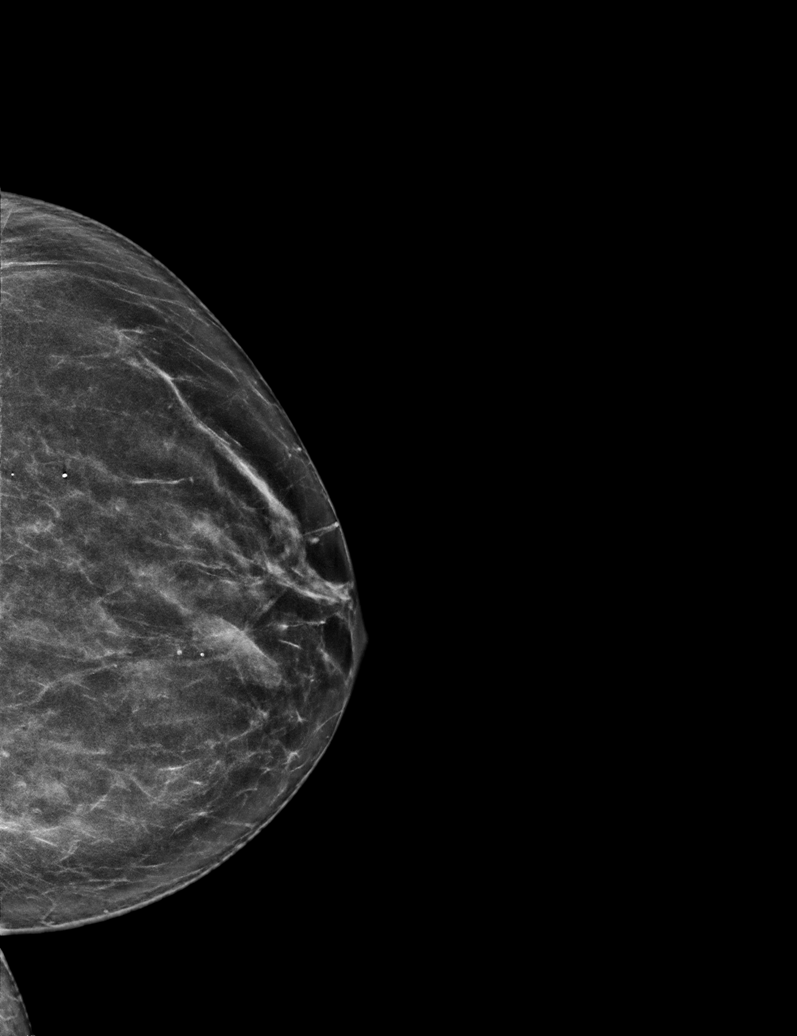

[L MLO synth-2D]
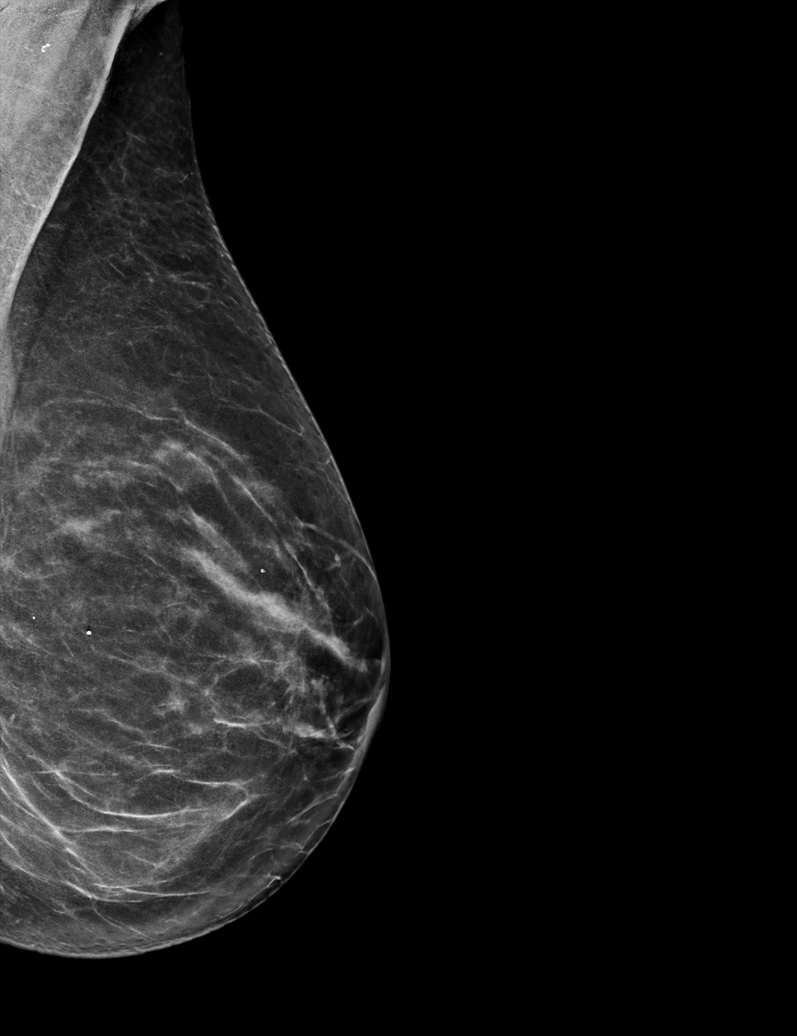

[R MLO synth-2D (1 of 2)]
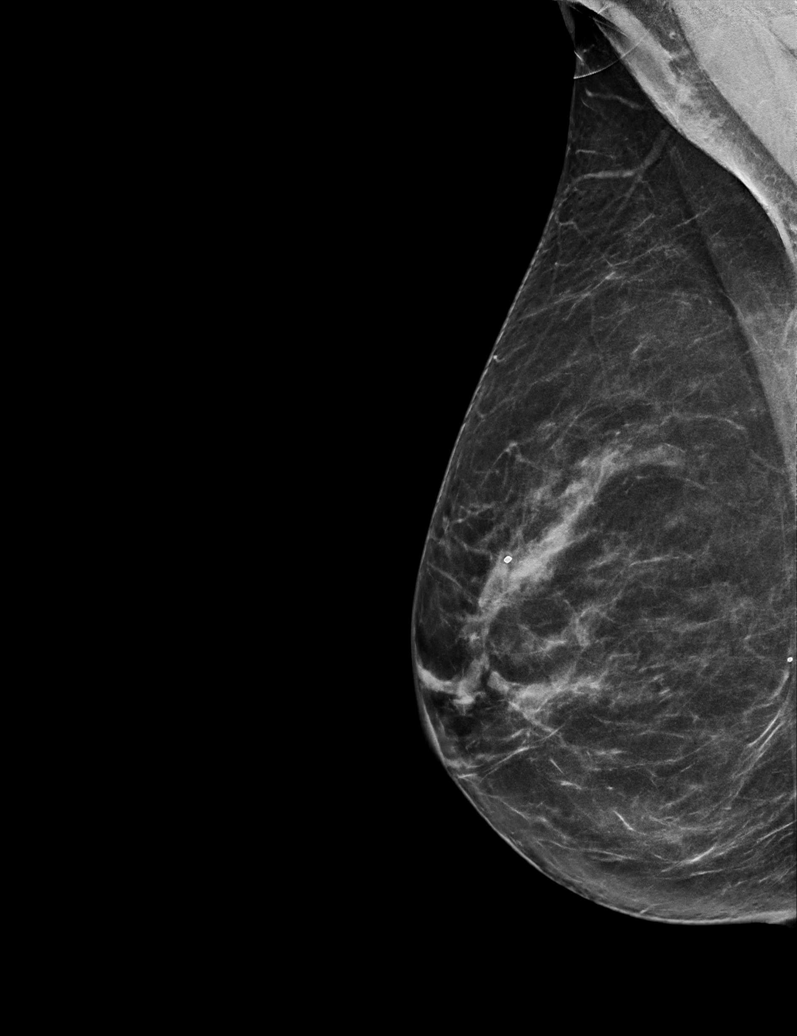

[R MLO synth-2D (2 of 2)]
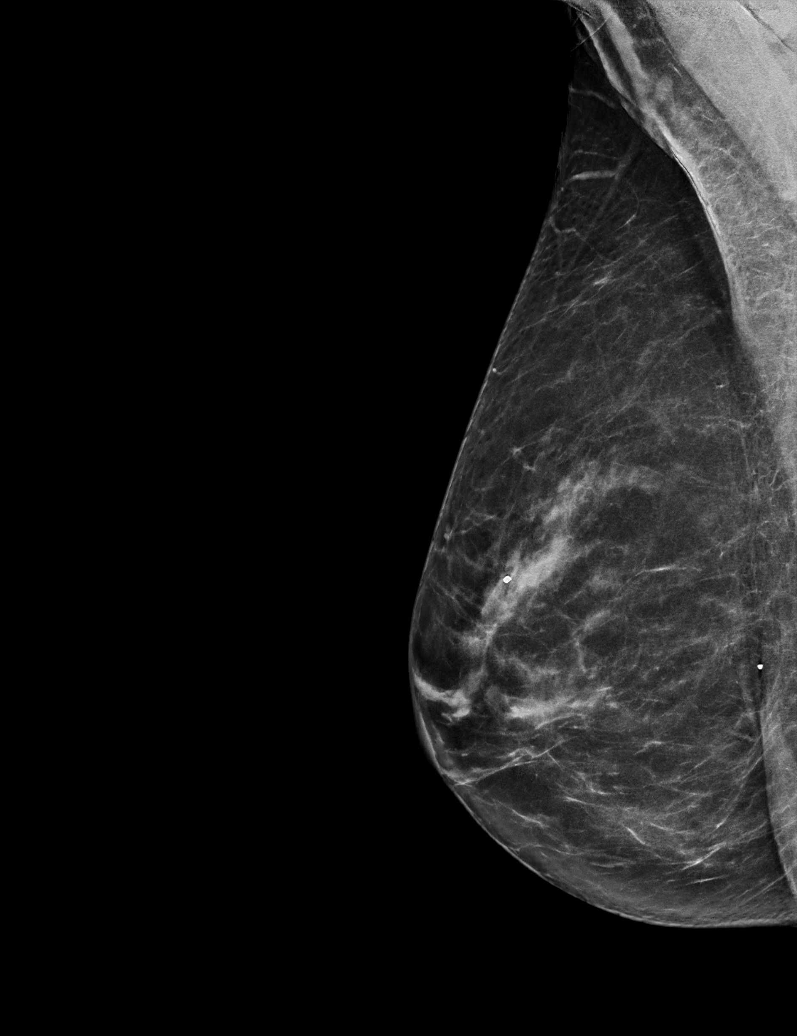

[R CC synth-2D]
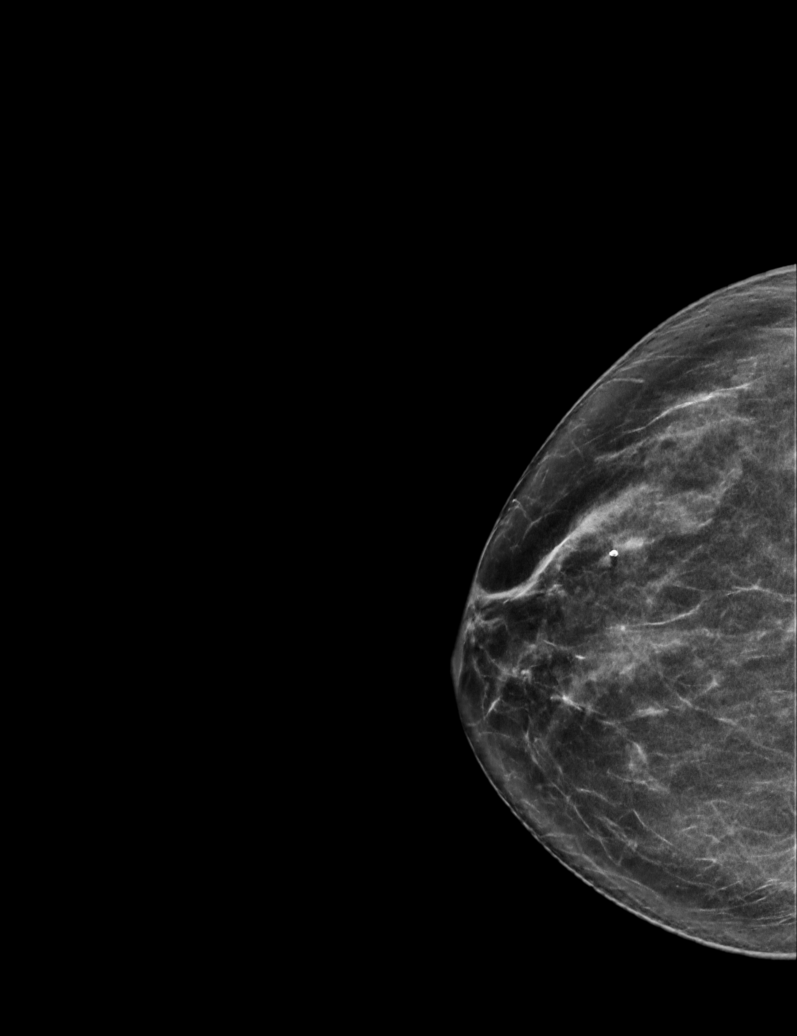

[R MLO tomo · tomo slice 29/58.0]
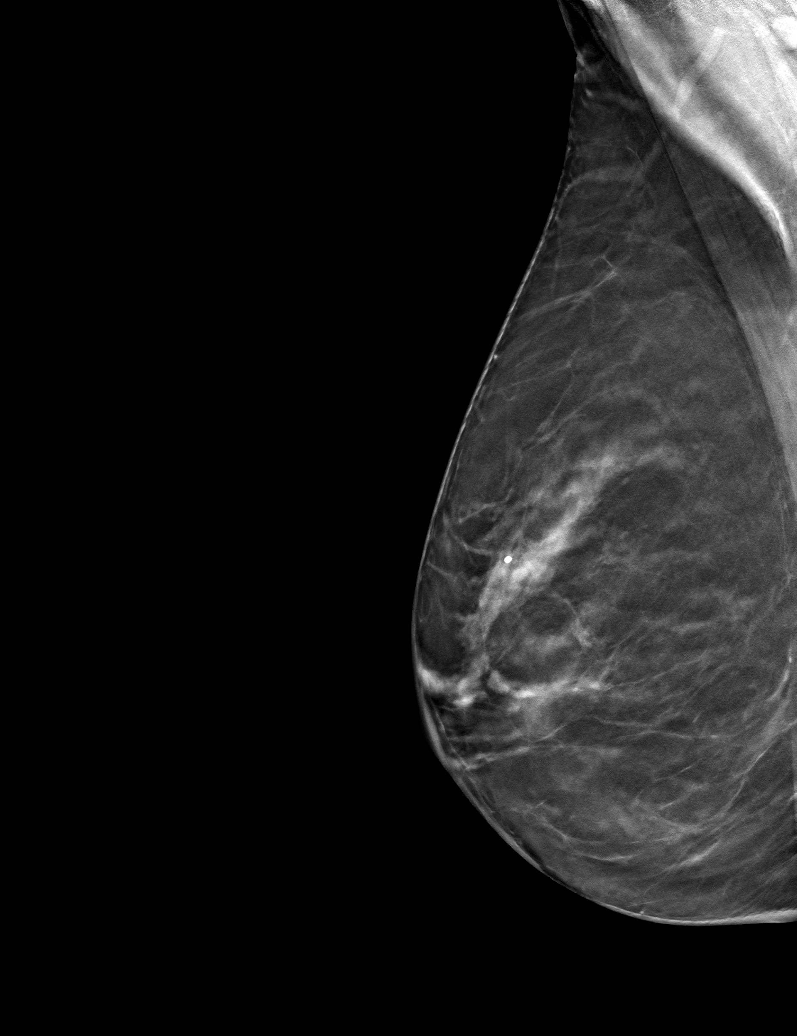

[6 of 30 positions shown; findings below may reference images not displayed]

ACR Breast Density Category b: There are scattered areas of
fibroglandular density.
FINDINGS: There are no findings suspicious for malignancy. Images were
processed with CAD.
IMPRESSION: No mammographic evidence of malignancy. A result letter of this
screening mammogram will be mailed directly to the patient.

RECOMMENDATION:
Screening mammogram in one year. (Code:[id number])

BI-RADS CATEGORY  1: Negative.

## 2018-05-28 DIAGNOSIS — D2261 Melanocytic nevi of right upper limb, including shoulder: Secondary | ICD-10-CM | POA: Diagnosis not present

## 2018-05-28 DIAGNOSIS — L821 Other seborrheic keratosis: Secondary | ICD-10-CM | POA: Diagnosis not present

## 2018-05-28 DIAGNOSIS — Z872 Personal history of diseases of the skin and subcutaneous tissue: Secondary | ICD-10-CM | POA: Diagnosis not present

## 2018-05-28 DIAGNOSIS — D2262 Melanocytic nevi of left upper limb, including shoulder: Secondary | ICD-10-CM | POA: Diagnosis not present

## 2018-05-28 DIAGNOSIS — D2272 Melanocytic nevi of left lower limb, including hip: Secondary | ICD-10-CM | POA: Diagnosis not present

## 2018-05-28 DIAGNOSIS — Z85828 Personal history of other malignant neoplasm of skin: Secondary | ICD-10-CM | POA: Diagnosis not present

## 2018-05-28 DIAGNOSIS — Z08 Encounter for follow-up examination after completed treatment for malignant neoplasm: Secondary | ICD-10-CM | POA: Diagnosis not present

## 2018-06-17 DIAGNOSIS — M436 Torticollis: Secondary | ICD-10-CM | POA: Diagnosis not present

## 2018-06-17 DIAGNOSIS — M9901 Segmental and somatic dysfunction of cervical region: Secondary | ICD-10-CM | POA: Diagnosis not present

## 2018-07-08 DIAGNOSIS — H401131 Primary open-angle glaucoma, bilateral, mild stage: Secondary | ICD-10-CM | POA: Diagnosis not present

## 2018-07-08 DIAGNOSIS — H26491 Other secondary cataract, right eye: Secondary | ICD-10-CM | POA: Diagnosis not present

## 2018-07-08 DIAGNOSIS — Z961 Presence of intraocular lens: Secondary | ICD-10-CM | POA: Diagnosis not present

## 2018-07-08 DIAGNOSIS — H26493 Other secondary cataract, bilateral: Secondary | ICD-10-CM | POA: Diagnosis not present

## 2018-07-08 DIAGNOSIS — H18413 Arcus senilis, bilateral: Secondary | ICD-10-CM | POA: Diagnosis not present

## 2018-07-15 ENCOUNTER — Telehealth: Payer: PPO | Admitting: Physician Assistant

## 2018-07-15 DIAGNOSIS — J019 Acute sinusitis, unspecified: Secondary | ICD-10-CM

## 2018-07-15 DIAGNOSIS — B9689 Other specified bacterial agents as the cause of diseases classified elsewhere: Secondary | ICD-10-CM

## 2018-07-15 MED ORDER — AZITHROMYCIN 250 MG PO TABS: ORAL_TABLET | ORAL | 0 refills | Status: AC

## 2018-07-15 NOTE — Progress Notes (Signed)
We are sorry that you are not feeling well.  Here is how we plan to help!  Based on what you have shared with me it looks like you have sinusitis.  Sinusitis is inflammation and infection in the sinus cavities of the head.  Based on your presentation I believe you most likely have Acute Bacterial Sinusitis.  This is an infection caused by bacteria and is treated with antibiotics. I have prescribed the zpack. You may use an oral decongestant such as Mucinex D or if you have glaucoma or high blood pressure use plain Mucinex. Saline nasal spray help and can safely be used as often as needed for congestion.  If you develop worsening sinus pain, fever or notice severe headache and vision changes, or if symptoms are not better after completion of antibiotic, please schedule an appointment with a health care provider.    Sinus infections are not as easily transmitted as other respiratory infection, however we still recommend that you avoid close contact with loved ones, especially the very young and elderly.  Remember to wash your hands thoroughly throughout the day as this is the number one way to prevent the spread of infection!  Home Care:  Only take medications as instructed by your medical team.  Complete the entire course of an antibiotic.  Do not take these medications with alcohol.  A steam or ultrasonic humidifier can help congestion.  You can place a towel over your head and breathe in the steam from hot water coming from a faucet.  Avoid close contacts especially the very young and the elderly.  Cover your mouth when you cough or sneeze.  Always remember to wash your hands.  Get Help Right Away If:  You develop worsening fever or sinus pain.  You develop a severe head ache or visual changes.  Your symptoms persist after you have completed your treatment plan.  Make sure you  Understand these instructions.  Will watch your condition.  Will get help right away if you are not  doing well or get worse.  Your e-visit answers were reviewed by a board certified advanced clinical practitioner to complete your personal care plan.  Depending on the condition, your plan could have included both over the counter or prescription medications.  If there is a problem please reply  once you have received a response from your provider.  Your safety is important to [place].  If you have drug allergies check your prescription carefully.    You can use MyChart to ask questions about today's visit, request a non-urgent call back, or ask for a work or school excuse for 24 hours related to this e-Visit. If it has been greater than 24 hours you will need to follow up with your provider, or enter a new e-Visit to address those concerns.  You will get an e-mail in the next two days asking about your experience.  I hope that your e-visit has been valuable and will speed your recovery. Thank you for using e-visits.

## 2018-08-05 DIAGNOSIS — H26492 Other secondary cataract, left eye: Secondary | ICD-10-CM | POA: Diagnosis not present

## 2018-10-02 ENCOUNTER — Telehealth: Payer: PPO | Admitting: Family

## 2018-10-02 DIAGNOSIS — B9689 Other specified bacterial agents as the cause of diseases classified elsewhere: Secondary | ICD-10-CM

## 2018-10-02 DIAGNOSIS — J028 Acute pharyngitis due to other specified organisms: Secondary | ICD-10-CM

## 2018-10-02 MED ORDER — PREDNISONE 5 MG PO TABS: 5.0000 mg | ORAL_TABLET | ORAL | 0 refills | Status: DC

## 2018-10-02 MED ORDER — BENZONATATE 100 MG PO CAPS: 100.0000 mg | ORAL_CAPSULE | Freq: Three times a day (TID) | ORAL | 0 refills | Status: DC | PRN

## 2018-10-02 MED ORDER — AZITHROMYCIN 250 MG PO TABS: ORAL_TABLET | ORAL | 0 refills | Status: DC

## 2018-10-02 NOTE — Addendum Note (Signed)
Addended by: Brunetta Jeans on: 10/02/2018 01:26 PM   Modules accepted: Orders

## 2018-10-02 NOTE — Progress Notes (Signed)

## 2018-12-10 DIAGNOSIS — H401131 Primary open-angle glaucoma, bilateral, mild stage: Secondary | ICD-10-CM | POA: Diagnosis not present

## 2019-02-23 ENCOUNTER — Encounter: Payer: PPO | Admitting: Internal Medicine

## 2019-03-17 ENCOUNTER — Encounter: Payer: Self-pay | Admitting: Internal Medicine

## 2019-03-25 ENCOUNTER — Encounter: Payer: PPO | Admitting: Internal Medicine

## 2019-04-13 DIAGNOSIS — H401131 Primary open-angle glaucoma, bilateral, mild stage: Secondary | ICD-10-CM | POA: Diagnosis not present

## 2019-05-27 DIAGNOSIS — D2262 Melanocytic nevi of left upper limb, including shoulder: Secondary | ICD-10-CM | POA: Diagnosis not present

## 2019-05-27 DIAGNOSIS — Z09 Encounter for follow-up examination after completed treatment for conditions other than malignant neoplasm: Secondary | ICD-10-CM | POA: Diagnosis not present

## 2019-05-27 DIAGNOSIS — Z85828 Personal history of other malignant neoplasm of skin: Secondary | ICD-10-CM | POA: Diagnosis not present

## 2019-05-27 DIAGNOSIS — D2261 Melanocytic nevi of right upper limb, including shoulder: Secondary | ICD-10-CM | POA: Diagnosis not present

## 2019-05-27 DIAGNOSIS — D225 Melanocytic nevi of trunk: Secondary | ICD-10-CM | POA: Diagnosis not present

## 2019-05-27 DIAGNOSIS — L570 Actinic keratosis: Secondary | ICD-10-CM | POA: Diagnosis not present

## 2019-05-27 DIAGNOSIS — Z872 Personal history of diseases of the skin and subcutaneous tissue: Secondary | ICD-10-CM | POA: Diagnosis not present

## 2019-05-27 DIAGNOSIS — X32XXXA Exposure to sunlight, initial encounter: Secondary | ICD-10-CM | POA: Diagnosis not present

## 2019-06-15 ENCOUNTER — Telehealth: Payer: Self-pay | Admitting: Internal Medicine

## 2019-06-15 DIAGNOSIS — R5383 Other fatigue: Secondary | ICD-10-CM

## 2019-06-15 DIAGNOSIS — E785 Hyperlipidemia, unspecified: Secondary | ICD-10-CM

## 2019-06-15 DIAGNOSIS — R7301 Impaired fasting glucose: Secondary | ICD-10-CM

## 2019-06-15 NOTE — Telephone Encounter (Signed)
The only labs are I see are march 2019.  She needs those done this year

## 2019-06-15 NOTE — Telephone Encounter (Signed)
Pt has a cpe on 07/22/2019, and would like to have labs done before her appt. Can Dr. Derrel Nip put labs in so a lab appt can be scheduled.

## 2019-06-15 NOTE — Telephone Encounter (Signed)
Pt had labs done 4 months ago. Is there anything that she needs to have checked before her physical on 07/22/2019?

## 2019-06-16 NOTE — Telephone Encounter (Signed)
Looked at the date wrong. I have ordered CBC, A1c, Lipid, CMP and TSH. Is there anything else that needs to be ordered?

## 2019-06-18 ENCOUNTER — Encounter: Payer: PPO | Admitting: Internal Medicine

## 2019-07-17 ENCOUNTER — Other Ambulatory Visit: Payer: Self-pay

## 2019-07-22 ENCOUNTER — Encounter: Payer: Self-pay | Admitting: Internal Medicine

## 2019-07-22 ENCOUNTER — Ambulatory Visit (INDEPENDENT_AMBULATORY_CARE_PROVIDER_SITE_OTHER): Payer: PPO | Admitting: Internal Medicine

## 2019-07-22 ENCOUNTER — Other Ambulatory Visit: Payer: Self-pay

## 2019-07-22 VITALS — BP 100/62 | HR 61 | Temp 97.7°F | Resp 15 | Ht 60.0 in | Wt 102.4 lb

## 2019-07-22 DIAGNOSIS — E785 Hyperlipidemia, unspecified: Secondary | ICD-10-CM

## 2019-07-22 DIAGNOSIS — F5104 Psychophysiologic insomnia: Secondary | ICD-10-CM

## 2019-07-22 DIAGNOSIS — Z1231 Encounter for screening mammogram for malignant neoplasm of breast: Secondary | ICD-10-CM

## 2019-07-22 DIAGNOSIS — M810 Age-related osteoporosis without current pathological fracture: Secondary | ICD-10-CM | POA: Diagnosis not present

## 2019-07-22 NOTE — Assessment & Plan Note (Signed)
By prior DEXA ,with lack of response to prior trials of bisphosphonates , SERMS and PTH. She is not interested in pursuing treatment with Prolia.

## 2019-07-22 NOTE — Assessment & Plan Note (Addendum)
Mild,  Last year's  10 YR RISK was 5%   using the FRC.  Repeat labs pending.  No therapy was  recommended other than Mediterranean style diet t   Lab Results  Component Value Date   CHOL 211 (H) 02/21/2018   HDL 62.80 02/21/2018   LDLCALC 134 (H) 02/21/2018   TRIG 72.0 02/21/2018   CHOLHDL 3 02/21/2018

## 2019-07-22 NOTE — Progress Notes (Signed)
Patient ID: Nicole Nixon, female    DOB: Apr 24, 1946  Age: 73 y.o. MRN: [id number]  The patient is here for annual follow up and  management of other chronic and acute problems.  Due for mammogram Colonoscopy 2018 normal.  10 yr follow up     The risk factors are reflected in the social history.  The roster of all physicians providing medical care to patient - is listed in the Snapshot section of the chart.  Activities of daily living:  The patient is 100% independent in all ADLs: dressing, toileting, feeding as well as independent mobility  Home safety : The patient has smoke detectors in the home. They wear seatbelts.  There are no firearms at home. There is no violence in the home.   There is no risks for hepatitis, STDs or HIV. There is no   history of blood transfusion. They have no travel history to infectious disease endemic areas of the world.  The patient has seen their dentist in the last six month. They have seen their eye doctor in the last year. They admit to slight hearing difficulty with regard to whispered voices and some television programs.  They have deferred audiologic testing in the last year.  They do not  have excessive sun exposure. Discussed the need for sun protection: hats, long sleeves and use of sunscreen if there is significant sun exposure.   Diet: the importance of a healthy diet is discussed. They do have a healthy diet.  The benefits of regular aerobic exercise were discussed. She walks 4 times per week ,  30 minutes.   Depression screen: there are no signs or vegative symptoms of depression- irritability, change in appetite, anhedonia, sadness/tearfullness.  Cognitive assessment: the patient manages all their financial and personal affairs and is actively engaged. They could relate day,date,year and events; recalled 2/3 objects at 3 minutes; performed clock-face test normally.  The following portions of the patient's history were reviewed and updated  as appropriate: allergies, current medications, past family history, past medical history,  past surgical history, past social history  and problem list.  Visual acuity was not assessed per patient preference since she has regular follow up with her ophthalmologist. Hearing and body mass index were assessed and reviewed.   During the course of the visit the patient was educated and counseled about appropriate screening and preventive services including : fall prevention , diabetes screening, nutrition counseling, colorectal cancer screening, and recommended immunizations.    CC: The primary encounter diagnosis was Encounter for screening mammogram for breast cancer. Diagnoses of Osteoporosis, post-menopausal, Chronic insomnia, and Hyperlipidemia LDL goal <130 were also pertinent to this visit.  The patient has no signs or symptoms of COVID 19 infection (fever, cough, sore throat  or shortness of breath beyond what is typical for patient).  Patient denies contact with other persons with the above mentioned symptoms or with anyone confirmed to have [place] has a past medical history of Allergy, Cancer ([place]) (2009), Cataract, Glaucoma, and Osteoporosis.   She has a past surgical history that includes Appendectomy (1975); Tonsillectomy and adenoidectomy (1958); Cesarean section (1978); Dilation and curettage of uterus (1975); and Cataract extraction, bilateral (2016).   Her family history is not on file. She was adopted.She reports that she quit smoking about 48 years ago. Her smoking use included cigarettes. She quit after 4.00 years of use. She has never used smokeless tobacco. She reports that she does not drink alcohol or  use drugs.  Outpatient Medications Prior to Visit  Medication Sig Dispense Refill  . timolol (BETIMOL) 0.25 % ophthalmic solution Apply 1 drop to eye 2 (two) times daily.     . Azelastine HCl 0.15 % SOLN Place 1 spray into both nostrils every other day.    Marland Kitchen  azithromycin (ZITHROMAX) 250 MG tablet Take 2 tabs now then 1 daily times 4 days (Patient not taking: Reported on 07/22/2019) 6 tablet 0  . benzonatate (TESSALON PERLES) 100 MG capsule Take 1-2 capsules (100-200 mg total) by mouth every 8 (eight) hours as needed for cough. 30 capsule 0  . loratadine-pseudoephedrine (CLARITIN-D 12-HOUR) 5-120 MG per tablet Take 1 tablet by mouth 2 (two) times daily.     Facility-Administered Medications Prior to Visit  Medication Dose Route Frequency Provider Last Rate Last Dose  . 0.9 %  sodium chloride infusion  500 mL Intravenous Continuous Pyrtle, Lajuan Lines, MD        Review of Systems   Patient denies headache, fevers, malaise, unintentional weight loss, skin rash, eye pain, sinus congestion and sinus pain, sore throat, dysphagia,  hemoptysis , cough, dyspnea, wheezing, chest pain, palpitations, orthopnea, edema, abdominal pain, nausea, melena, diarrhea, constipation, flank pain, dysuria, hematuria, urinary  Frequency, nocturia, numbness, tingling, seizures,  Focal weakness, Loss of consciousness,  Tremor, insomnia, depression, anxiety, and suicidal ideation.      Objective:  BP 100/62 (BP Location: Left Arm, Patient Position: Sitting, Cuff Size: Normal)   Pulse 61   Temp 97.7 F (36.5 C) (Oral)   Resp 15   Ht 5' (1.524 m)   Wt 102 lb 6.4 oz (46.4 kg)   SpO2 96%   BMI 20.00 kg/m   Physical Exam   General appearance: alert, cooperative and appears stated age Head: Normocephalic, without obvious abnormality, atraumatic Eyes: conjunctivae/corneas clear. PERRL, EOM's intact. Fundi benign. Ears: normal TM's and external ear canals both ears Nose: Nares normal. Septum midline. Mucosa normal. No drainage or sinus tenderness. Throat: lips, mucosa, and tongue normal; teeth and gums normal Neck: no adenopathy, no carotid bruit, no JVD, supple, symmetrical, trachea midline and thyroid not enlarged, symmetric, no tenderness/mass/nodules Lungs: clear to  auscultation bilaterally Breasts: normal appearance, no masses or tenderness Heart: regular rate and rhythm, S1, S2 normal, no murmur, click, rub or gallop Abdomen: soft, non-tender; bowel sounds normal; no masses,  no organomegaly Extremities: extremities normal, atraumatic, no cyanosis or edema Pulses: 2+ and symmetric Skin: Skin color, texture, turgor normal. No rashes or lesions Neurologic: Alert and oriented X 3, normal strength and tone. Normal symmetric reflexes. Normal coordination and gait.      Assessment & Plan:   Problem List Items Addressed This Visit      Unprioritized   Osteoporosis, post-menopausal    By prior DEXA ,with lack of response to prior trials of bisphosphonates , SERMS and PTH. She is not interested in pursuing treatment with Prolia.         Chronic insomnia    Averaging 6 to 6.5 hours  Per night ,  No daytime somnolence.  No anxiety.       Hyperlipidemia LDL goal <130    Mild,  Last year's  10 YR RISK was 5%   using the FRC.  Repeat labs pending.  No therapy was  recommended other than Mediterranean style diet t   Lab Results  Component Value Date   CHOL 211 (H) 02/21/2018   HDL 62.80 02/21/2018   LDLCALC 134 (H) 02/21/2018  TRIG 72.0 02/21/2018   CHOLHDL 3 02/21/2018          Other Visit Diagnoses    Encounter for screening mammogram for breast cancer    -  Primary   Relevant Orders   MM 3D SCREEN BREAST BILATERAL      I have discontinued Melea M. Montesano's loratadine-pseudoephedrine, Azelastine HCl, azithromycin, and benzonatate. I am also having her maintain her timolol. We will continue to administer sodium chloride.  No orders of the defined types were placed in this encounter.   Medications Discontinued During This Encounter  Medication Reason  . Azelastine HCl 0.15 % SOLN Patient has not taken in last 30 days  . azithromycin (ZITHROMAX) 250 MG tablet Patient has not taken in last 30 days  . benzonatate (TESSALON PERLES)  100 MG capsule Patient has not taken in last 30 days  . loratadine-pseudoephedrine (CLARITIN-D 12-HOUR) 5-120 MG per tablet Patient has not taken in last 30 days  A total of 25 minutes of face to face time was spent with patient more than half of which was spent in counselling about the above mentioned conditions  and coordination of care   Follow-up: No follow-ups on file.   Crecencio Mc, MD

## 2019-07-22 NOTE — Assessment & Plan Note (Signed)
Averaging 6 to 6.5 hours  Per night ,  No daytime somnolence.  No anxiety.

## 2019-07-22 NOTE — Patient Instructions (Signed)
Your annual mammogram has been ordered.  You are encouraged to call to make your appointment at [facility] (they no longer want [place] to schedule for you!)   Health Maintenance After Age 72 After age 39, you are at a higher risk for certain long-term diseases and infections as well as injuries from falls. Falls are a major cause of broken bones and head injuries in people who are older than age 35. Getting regular preventive care can help to keep you healthy and well. Preventive care includes getting regular testing and making lifestyle changes as recommended by your health care provider. Talk with your health care provider about:  Which screenings and tests you should have. A screening is a test that checks for a disease when you have no symptoms.  A diet and exercise plan that is right for you. What should I know about screenings and tests to prevent falls? Screening and testing are the best ways to find a health problem early. Early diagnosis and treatment give you the best chance of managing medical conditions that are common after age 59. Certain conditions and lifestyle choices may make you more likely to have a fall. Your health care provider may recommend:  Regular vision checks. Poor vision and conditions such as cataracts can make you more likely to have a fall. If you wear glasses, make sure to get your prescription updated if your vision changes.  Medicine review. Work with your health care provider to regularly review all of the medicines you are taking, including over-the-counter medicines. Ask your health care provider about any side effects that may make you more likely to have a fall. Tell your health care provider if any medicines that you take make you feel dizzy or sleepy.  Osteoporosis screening. Osteoporosis is a condition that causes the bones to get weaker. This can make the bones weak and cause them to break more easily.  Blood pressure screening. Blood pressure  changes and medicines to control blood pressure can make you feel dizzy.  Strength and balance checks. Your health care provider may recommend certain tests to check your strength and balance while standing, walking, or changing positions.  Foot health exam. Foot pain and numbness, as well as not wearing proper footwear, can make you more likely to have a fall.  Depression screening. You may be more likely to have a fall if you have a fear of falling, feel emotionally low, or feel unable to do activities that you used to do.  Alcohol use screening. Using too much alcohol can affect your balance and may make you more likely to have a fall. What actions can I take to lower my risk of falls? General instructions  Talk with your health care provider about your risks for falling. Tell your health care provider if: ? You fall. Be sure to tell your health care provider about all falls, even ones that seem minor. ? You feel dizzy, sleepy, or off-balance.  Take over-the-counter and prescription medicines only as told by your health care provider. These include any supplements.  Eat a healthy diet and maintain a healthy weight. A healthy diet includes low-fat dairy products, low-fat (lean) meats, and fiber from whole grains, beans, and lots of fruits and vegetables. Home safety  Remove any tripping hazards, such as rugs, cords, and clutter.  Install safety equipment such as grab bars in bathrooms and safety rails on stairs.  Keep rooms and walkways well-lit. Activity   Follow a regular  exercise program to stay fit. This will help you maintain your balance. Ask your health care provider what types of exercise are appropriate for you.  If you need a cane or walker, use it as recommended by your health care provider.  Wear supportive shoes that have nonskid soles. Lifestyle  Do not drink alcohol if your health care provider tells you not to drink.  If you drink alcohol, limit how much you  have: ? 0-1 drink a day for women. ? 0-2 drinks a day for men.  Be aware of how much alcohol is in your drink. In the [place] one drink equals one typical bottle of beer (12 oz), one-half glass of wine (5 oz), or one shot of hard liquor (1 oz).  Do not use any products that contain nicotine or tobacco, such as cigarettes and e-cigarettes. If you need help quitting, ask your health care provider. Summary  Having a healthy lifestyle and getting preventive care can help to protect your health and wellness after age 12.  Screening and testing are the best way to find a health problem early and help you avoid having a fall. Early diagnosis and treatment give you the best chance for managing medical conditions that are more common for people who are older than age 38.  Falls are a major cause of broken bones and head injuries in people who are older than age 75. Take precautions to prevent a fall at home.  Work with your health care provider to learn what changes you can make to improve your health and wellness and to prevent falls. This information is not intended to replace advice given to you by your health care provider. Make sure you discuss any questions you have with your health care provider. Document Released: 10/02/2017 Document Revised: 03/12/2019 Document Reviewed: 10/02/2017 Elsevier Patient Education  2020 [employer].

## 2019-08-04 ENCOUNTER — Other Ambulatory Visit: Payer: PPO

## 2019-08-11 ENCOUNTER — Other Ambulatory Visit: Payer: Self-pay

## 2019-08-11 ENCOUNTER — Other Ambulatory Visit (INDEPENDENT_AMBULATORY_CARE_PROVIDER_SITE_OTHER): Payer: PPO

## 2019-08-11 DIAGNOSIS — R7301 Impaired fasting glucose: Secondary | ICD-10-CM | POA: Diagnosis not present

## 2019-08-11 DIAGNOSIS — E785 Hyperlipidemia, unspecified: Secondary | ICD-10-CM | POA: Diagnosis not present

## 2019-08-11 DIAGNOSIS — R5383 Other fatigue: Secondary | ICD-10-CM | POA: Diagnosis not present

## 2019-08-11 LAB — CBC WITH DIFFERENTIAL/PLATELET
Basophils Absolute: 0 10*3/uL (ref 0.0–0.1)
Basophils Relative: 0.8 % (ref 0.0–3.0)
Eosinophils Absolute: 0.1 10*3/uL (ref 0.0–0.7)
Eosinophils Relative: 3.6 % (ref 0.0–5.0)
HCT: 41.8 % (ref 36.0–46.0)
Hemoglobin: 14.1 g/dL (ref 12.0–15.0)
Lymphocytes Relative: 44.2 % (ref 12.0–46.0)
Lymphs Abs: 1.8 10*3/uL (ref 0.7–4.0)
MCHC: 33.7 g/dL (ref 30.0–36.0)
MCV: 93.1 fl (ref 78.0–100.0)
Monocytes Absolute: 0.5 10*3/uL (ref 0.1–1.0)
Monocytes Relative: 13 % — ABNORMAL HIGH (ref 3.0–12.0)
Neutro Abs: 1.6 10*3/uL (ref 1.4–7.7)
Neutrophils Relative %: 38.4 % — ABNORMAL LOW (ref 43.0–77.0)
Platelets: 225 10*3/uL (ref 150.0–400.0)
RBC: 4.49 Mil/uL (ref 3.87–5.11)
RDW: 13.2 % (ref 11.5–15.5)
WBC: 4.1 10*3/uL (ref 4.0–10.5)

## 2019-08-11 LAB — LIPID PANEL
Cholesterol: 195 mg/dL (ref 0–200)
HDL: 55 mg/dL (ref >39.00)
LDL Cholesterol: 125 mg/dL — ABNORMAL HIGH (ref 0–99)
NonHDL: 140.15
Total CHOL/HDL Ratio: 4
Triglycerides: 78 mg/dL (ref 0.0–149.0)
VLDL: 15.6 mg/dL (ref 0.0–40.0)

## 2019-08-11 LAB — HEMOGLOBIN A1C: Hgb A1c MFr Bld: 6.1 % (ref 4.6–6.5)

## 2019-08-11 LAB — COMPREHENSIVE METABOLIC PANEL
ALT: 12 U/L (ref 0–35)
AST: 17 U/L (ref 0–37)
Albumin: 4.1 g/dL (ref 3.5–5.2)
Alkaline Phosphatase: 75 U/L (ref 39–117)
BUN: 19 mg/dL (ref 6–23)
CO2: 31 mEq/L (ref 19–32)
Calcium: 9.2 mg/dL (ref 8.4–10.5)
Chloride: 104 mEq/L (ref 96–112)
Creatinine, Ser: 0.77 mg/dL (ref 0.40–1.20)
GFR: 73.74 mL/min (ref >60.00)
Glucose, Bld: 91 mg/dL (ref 70–99)
Potassium: 4.3 mEq/L (ref 3.5–5.1)
Sodium: 141 mEq/L (ref 135–145)
Total Bilirubin: 0.5 mg/dL (ref 0.2–1.2)
Total Protein: 7.2 g/dL (ref 6.0–8.3)

## 2019-08-11 LAB — TSH: TSH: 3 u[IU]/mL (ref 0.35–4.50)

## 2019-08-14 ENCOUNTER — Encounter: Payer: Self-pay | Admitting: Internal Medicine

## 2019-08-25 ENCOUNTER — Ambulatory Visit
Admission: RE | Admit: 2019-08-25 | Discharge: 2019-08-25 | Disposition: A | Discharge status: Home / Self Care | Payer: PPO | Source: Ambulatory Visit | Attending: Internal Medicine | Admitting: Internal Medicine

## 2019-08-25 DIAGNOSIS — Z1231 Encounter for screening mammogram for malignant neoplasm of breast: Secondary | ICD-10-CM | POA: Diagnosis not present

## 2019-09-21 ENCOUNTER — Other Ambulatory Visit: Payer: Self-pay

## 2019-09-21 ENCOUNTER — Ambulatory Visit (INDEPENDENT_AMBULATORY_CARE_PROVIDER_SITE_OTHER): Payer: PPO | Admitting: Internal Medicine

## 2019-09-21 ENCOUNTER — Encounter: Payer: Self-pay | Admitting: Internal Medicine

## 2019-09-21 DIAGNOSIS — J0100 Acute maxillary sinusitis, unspecified: Secondary | ICD-10-CM

## 2019-09-21 MED ORDER — PREDNISONE 10 MG PO TABS: ORAL_TABLET | ORAL | 0 refills | Status: DC

## 2019-09-21 MED ORDER — LEVOFLOXACIN 500 MG PO TABS: 500.0000 mg | ORAL_TABLET | Freq: Every day | ORAL | 0 refills | Status: DC

## 2019-09-21 NOTE — Assessment & Plan Note (Signed)
Levaquin x 7 days and prednisone taper (if tolerated) prescribed.  If no improvement in 48 hours notify me.

## 2019-09-21 NOTE — Patient Instructions (Addendum)
I  am treating you for bacterial sinusitis which is probably a  complication from your allergies causing  persistent sinus congestion.   I am prescribing an antibiotic (levaquin)   To manage the infection and the inflammation in your ear/sinuses, and prednisone taper for the inflammation.  You can reduce the prednisone dose to 20 mg ( 2 10 mg tablets)  Daily for 5 days instead of the usual taper    I also advise use of the following OTC meds to help with your other symptoms.   You can use Sudafed PE 20 mg every 6 hours  if needed for congestion,   flush your sinuses twice daily with NeilMed sinus rinse  (do over the sink because if you do it right you will spit out globs of mucus)  Please take a probiotic ( Align, Flora que or Culturelle) OR A GENERIC EQUIVALENT for three weeks since you are taking an  antibiotic to prevent a very serious antibiotic associated infection  Called clostridium dificile colitis that can cause diarrhea, multi organ failure, sepsis and death if not managed.

## 2019-09-21 NOTE — Progress Notes (Signed)
Subjective:  Patient ID: Nicole Nixon, female    DOB: October 30, 1946  Age: 73 y.o. MRN: [id number]  CC: The encounter diagnosis was Acute maxillary sinusitis, recurrence not specified.  HPI Nicole Nixon presents for evaluation and treatment of sinusitis.  Symptoms started 2 weeks ago with sinus  pressure,  Congestion and rhinitis.  She is no longer having copious amounts of drainage but has a dray cough and bilateral maxillary sinus pressure .  Cough is managed with  [place]  No fevers or body aches.  Has been isolating at home.  No known contacts.     Outpatient Medications Prior to Visit  Medication Sig Dispense Refill  . timolol (TIMOPTIC) 0.5 % ophthalmic solution Place 1 drop into the left eye 2 (two) times daily.     . timolol (BETIMOL) 0.25 % ophthalmic solution Apply 1 drop to eye 2 (two) times daily.      Facility-Administered Medications Prior to Visit  Medication Dose Route Frequency Provider Last Rate Last Dose  . 0.9 %  sodium chloride infusion  500 mL Intravenous Continuous Pyrtle, Lajuan Lines, MD        Review of Systems;  Patient denies headache, fevers, malaise, unintentional weight loss, skin rash, eye pain, sinus congestion and sinus pain, sore throat, dysphagia,  hemoptysis , cough, dyspnea, wheezing, chest pain, palpitations, orthopnea, edema, abdominal pain, nausea, melena, diarrhea, constipation, flank pain, dysuria, hematuria, urinary  Frequency, nocturia, numbness, tingling, seizures,  Focal weakness, Loss of consciousness,  Tremor, insomnia, depression, anxiety, and suicidal ideation.      Objective:  Ht 5' (1.524 m)   Wt 102 lb 6.4 oz (46.4 kg)   BMI 20.00 kg/m   BP Readings from Last 3 Encounters:  07/22/19 100/62  02/21/18 104/62  05/01/17 (!) 105/56    Wt Readings from Last 3 Encounters:  09/21/19 102 lb 6.4 oz (46.4 kg)  07/22/19 102 lb 6.4 oz (46.4 kg)  02/21/18 101 lb 9.6 oz (46.1 kg)    General appearance: alert, cooperative and  appears stated age Ears: normal TM's and external ear canals both ears Throat: lips, mucosa, and tongue normal; teeth and gums normal Neck: no adenopathy, no carotid bruit, supple, symmetrical, trachea midline and thyroid not enlarged, symmetric, no tenderness/mass/nodules Back: symmetric, no curvature. ROM normal. No CVA tenderness. Lungs: clear to auscultation bilaterally Heart: regular rate and rhythm, S1, S2 normal, no murmur, click, rub or gallop Abdomen: soft, non-tender; bowel sounds normal; no masses,  no organomegaly Pulses: 2+ and symmetric Skin: Skin color, texture, turgor normal. No rashes or lesions Lymph nodes: Cervical, supraclavicular, and axillary nodes normal.  Lab Results  Component Value Date   HGBA1C 6.1 08/11/2019    Lab Results  Component Value Date   CREATININE 0.77 08/11/2019   CREATININE 0.78 02/21/2018   CREATININE 0.83 12/07/2016    Lab Results  Component Value Date   WBC 4.1 08/11/2019   HGB 14.1 08/11/2019   HCT 41.8 08/11/2019   PLT 225.0 08/11/2019   GLUCOSE 91 08/11/2019   CHOL 195 08/11/2019   TRIG 78.0 08/11/2019   HDL 55.00 08/11/2019   LDLCALC 125 (H) 08/11/2019   ALT 12 08/11/2019   AST 17 08/11/2019   NA 141 08/11/2019   K 4.3 08/11/2019   CL 104 08/11/2019   CREATININE 0.77 08/11/2019   BUN 19 08/11/2019   CO2 31 08/11/2019   TSH 3.00 08/11/2019   HGBA1C 6.1 08/11/2019    Mm 3d Screen Breast  Bilateral  Result Date: 08/25/2019 CLINICAL DATA:  Screening. EXAM: DIGITAL SCREENING BILATERAL MAMMOGRAM WITH TOMO AND CAD COMPARISON:  Previous exam(s). ACR Breast Density Category b: There are scattered areas of fibroglandular density. FINDINGS: There are no findings suspicious for malignancy. Images were processed with CAD. IMPRESSION: No mammographic evidence of malignancy. A result letter of this screening mammogram will be mailed directly to the patient. RECOMMENDATION: Screening mammogram in one year. (Code:SM-B-01Y) BI-RADS CATEGORY   1: Negative. Electronically Signed   By: Everlean Alstrom M.D.   On: 08/25/2019 16:19    Assessment & Plan:   Problem List Items Addressed This Visit      Unprioritized   Sinusitis, acute maxillary    Levaquin x 7 days and prednisone taper (if tolerated) prescribed.  If no improvement in 48 hours notify me.       Relevant Medications   levofloxacin (LEVAQUIN) 500 MG tablet   predniSONE (DELTASONE) 10 MG tablet      I am having Nicole Nixon start on levofloxacin and predniSONE. I am also having her maintain her timolol. We will continue to administer sodium chloride.  Meds ordered this encounter  Medications  . levofloxacin (LEVAQUIN) 500 MG tablet    Sig: Take 1 tablet (500 mg total) by mouth daily.    Dispense:  7 tablet    Refill:  0  . predniSONE (DELTASONE) 10 MG tablet    Sig: 6 tablets on Day 1 , then reduce by 1 tablet daily until gone    Dispense:  21 tablet    Refill:  0    Medications Discontinued During This Encounter  Medication Reason  . timolol (BETIMOL) 0.25 % ophthalmic solution Change in therapy    Follow-up: No follow-ups on file.   Crecencio Mc, MD

## 2019-11-20 ENCOUNTER — Telehealth: Payer: PPO | Admitting: Physician Assistant

## 2019-11-20 DIAGNOSIS — J019 Acute sinusitis, unspecified: Secondary | ICD-10-CM | POA: Diagnosis not present

## 2019-11-20 MED ORDER — AZITHROMYCIN 250 MG PO TABS: ORAL_TABLET | ORAL | 0 refills | Status: DC

## 2019-11-20 NOTE — Progress Notes (Signed)
We are sorry that you are not feeling well.  Here is how we plan to help!  Based on what you have shared with me it looks like you have sinusitis.  Sinusitis is inflammation and infection in the sinus cavities of the head.  Based on your presentation I believe you most likely have Acute Bacterial Sinusitis.  This is an infection caused by bacteria and is treated with antibiotics. I have prescribed a Z--pack. You may use an oral decongestant such as Mucinex D or if you have glaucoma or high blood pressure use plain Mucinex. Saline nasal spray help and can safely be used as often as needed for congestion.  If you develop worsening sinus pain, fever or notice severe headache and vision changes, or if symptoms are not better after completion of antibiotic, please schedule an appointment with a health care provider.    Sinus infections are not as easily transmitted as other respiratory infection, however we still recommend that you avoid close contact with loved ones, especially the very young and elderly.  Remember to wash your hands thoroughly throughout the day as this is the number one way to prevent the spread of infection!  Home Care:  Only take medications as instructed by your medical team.  Complete the entire course of an antibiotic.  Do not take these medications with alcohol.  A steam or ultrasonic humidifier can help congestion.  You can place a towel over your head and breathe in the steam from hot water coming from a faucet.  Avoid close contacts especially the very young and the elderly.  Cover your mouth when you cough or sneeze.  Always remember to wash your hands.  Get Help Right Away If:  You develop worsening fever or sinus pain.  You develop a severe head ache or visual changes.  Your symptoms persist after you have completed your treatment plan.  Make sure you  Understand these instructions.  Will watch your condition.  Will get help right away if you are not  doing well or get worse.  Your e-visit answers were reviewed by a board certified advanced clinical practitioner to complete your personal care plan.  Depending on the condition, your plan could have included both over the counter or prescription medications.  If there is a problem please reply  once you have received a response from your provider.  Your safety is important to [place].  If you have drug allergies check your prescription carefully.    You can use MyChart to ask questions about today's visit, request a non-urgent call back, or ask for a work or school excuse for 24 hours related to this e-Visit. If it has been greater than 24 hours you will need to follow up with your provider, or enter a new e-Visit to address those concerns.  You will get an e-mail in the next two days asking about your experience.  I hope that your e-visit has been valuable and will speed your recovery. Thank you for using e-visits.   Greater than 5 minutes, yet less than 10 minutes of time have been spent researching, coordinating, and implementing care for this patient today

## 2019-11-23 ENCOUNTER — Ambulatory Visit: Payer: PPO | Admitting: Internal Medicine

## 2020-01-12 DIAGNOSIS — H02884 Meibomian gland dysfunction left upper eyelid: Secondary | ICD-10-CM | POA: Diagnosis not present

## 2020-01-12 DIAGNOSIS — H401131 Primary open-angle glaucoma, bilateral, mild stage: Secondary | ICD-10-CM | POA: Diagnosis not present

## 2020-01-12 DIAGNOSIS — H02883 Meibomian gland dysfunction of right eye, unspecified eyelid: Secondary | ICD-10-CM | POA: Diagnosis not present

## 2020-01-12 DIAGNOSIS — H02886 Meibomian gland dysfunction of left eye, unspecified eyelid: Secondary | ICD-10-CM | POA: Diagnosis not present

## 2020-01-12 DIAGNOSIS — Z961 Presence of intraocular lens: Secondary | ICD-10-CM | POA: Diagnosis not present

## 2020-01-12 DIAGNOSIS — H02881 Meibomian gland dysfunction right upper eyelid: Secondary | ICD-10-CM | POA: Diagnosis not present

## 2020-05-22 ENCOUNTER — Telehealth: Payer: PPO | Admitting: Family

## 2020-05-22 DIAGNOSIS — J069 Acute upper respiratory infection, unspecified: Secondary | ICD-10-CM

## 2020-05-22 MED ORDER — FLUTICASONE PROPIONATE 50 MCG/ACT NA SUSP: 2.0000 | Freq: Every day | NASAL | 6 refills | Status: DC

## 2020-05-22 NOTE — Progress Notes (Signed)

## 2020-05-23 ENCOUNTER — Ambulatory Visit
Admission: EM | Admit: 2020-05-23 | Discharge: 2020-05-23 | Disposition: A | Discharge status: Home / Self Care | Payer: PPO | Attending: Emergency Medicine | Admitting: Emergency Medicine

## 2020-05-23 ENCOUNTER — Other Ambulatory Visit: Payer: Self-pay

## 2020-05-23 DIAGNOSIS — J069 Acute upper respiratory infection, unspecified: Secondary | ICD-10-CM

## 2020-05-23 MED ORDER — BENZONATATE 100 MG PO CAPS: 100.0000 mg | ORAL_CAPSULE | Freq: Three times a day (TID) | ORAL | 0 refills | Status: DC | PRN

## 2020-05-23 NOTE — ED Provider Notes (Signed)
[name]    CSN: [id number] Arrival date & time: 05/23/20  1533      History   Chief Complaint Chief Complaint  Patient presents with  . Sinus Problem    HPI Nicole Nixon is a 74 y.o. female.   Patient presents with sinus congestion, postnasal drip, hoarse voice, cough productive of yellow phlegm x 3 days.  She feels like she has chest congestion today.  She denies fever, chills, shortness of breath, vomiting, diarrhea, rash, or other symptoms.  Treatment attempted at home with Flonase.  Patient had an ED visit with her PCP yesterday and was diagnosed with a viral URI.    The history is provided by the patient.    Past Medical History:  Diagnosis Date  . Allergy   . Cancer ([place]) 2009   basal cell on forehead  . Cataract   . Glaucoma   . Osteoporosis     Patient Active Problem List   Diagnosis Date Noted  . Hyperlipidemia LDL goal <130 12/16/2016  . Encounter for preventive health examination 12/16/2016  . Sinusitis, acute maxillary 01/11/2015  . IBS (irritable bowel syndrome) 07/13/2014  . Medicare annual wellness visit, subsequent 04/21/2013  . Osteoporosis, post-menopausal 12/16/2012  . Chronic insomnia 12/16/2012    Past Surgical History:  Procedure Laterality Date  . APPENDECTOMY  1975  . CATARACT EXTRACTION, BILATERAL  2016  . [place]  . DILATION AND CURETTAGE OF UTERUS  1975  . TONSILLECTOMY AND ADENOIDECTOMY  1958    OB History   No obstetric history on file.      Home Medications    Prior to Admission medications   Medication Sig Start Date End Date Taking? Authorizing Provider  azithromycin (ZITHROMAX) 250 MG tablet Take 500 mg (2 tablets) on day 1 and 250 mg (1 tablet) day 2 through 5. 11/20/19   Muthersbaugh, Jarrett Soho, PA-C  benzonatate (TESSALON) 100 MG capsule Take 1 capsule (100 mg total) by mouth 3 (three) times daily as needed for cough. 05/23/20   Sharion Balloon, NP  fluticasone (FLONASE) 50 MCG/ACT nasal  spray Place 2 sprays into both nostrils daily. 05/22/20   Sharion Balloon, FNP  levofloxacin (LEVAQUIN) 500 MG tablet Take 1 tablet (500 mg total) by mouth daily. 09/21/19   Crecencio Mc, MD  predniSONE (DELTASONE) 10 MG tablet 6 tablets on Day 1 , then reduce by 1 tablet daily until gone 09/21/19   Crecencio Mc, MD  timolol (TIMOPTIC) 0.5 % ophthalmic solution Place 1 drop into the left eye 2 (two) times daily.  09/07/19   [provider]    Family History Family History  Adopted: Yes  Problem Relation Age of Onset  . Colon cancer Neg Hx     Social History Social History   Tobacco Use  . Smoking status: Former Smoker    Years: 4.00    Types: Cigarettes    Quit date: 12/16/1970    Years since quitting: 49.4  . Smokeless tobacco: Never Used  Substance Use Topics  . Alcohol use: No  . Drug use: No     Allergies   Codeine, Erythromycin, Penicillins, Influenza vac split quad, and Prednisone   Review of Systems Review of Systems  Constitutional: Negative for chills and fever.  HENT: Positive for congestion, postnasal drip and sore throat. Negative for ear pain.   Eyes: Negative for pain and visual disturbance.  Respiratory: Positive for cough. Negative for shortness of breath.  Cardiovascular: Negative for chest pain and palpitations.  Gastrointestinal: Negative for abdominal pain and vomiting.  Genitourinary: Negative for dysuria and hematuria.  Musculoskeletal: Negative for arthralgias and back pain.  Skin: Negative for color change and rash.  Neurological: Negative for seizures and syncope.  All other systems reviewed and are negative.    Physical Exam Triage Vital Signs ED Triage Vitals  Enc Vitals Group     BP      Pulse      Resp      Temp      Temp src      SpO2      Weight      Height      Head Circumference      Peak Flow      Pain Score      Pain Loc      Pain Edu?      Excl. in [place]?    No data found.  Updated Vital Signs BP  135/65 (BP Location: Left Arm)   Pulse 80   Temp 98.9 F (37.2 C) (Oral)   Resp 16   SpO2 96%   Visual Acuity Right Eye Distance:   Left Eye Distance:   Bilateral Distance:    Right Eye Near:   Left Eye Near:    Bilateral Near:     Physical Exam Vitals and nursing note reviewed.  Constitutional:      General: She is not in acute distress.    Appearance: She is well-developed. She is not ill-appearing.  HENT:     Head: Normocephalic and atraumatic.     Right Ear: Tympanic membrane normal.     Left Ear: Tympanic membrane normal.     Nose: Nose normal.     Comments: Clear post nasal drip.     Mouth/Throat:     Mouth: Mucous membranes are moist.     Pharynx: Oropharynx is clear.  Eyes:     Conjunctiva/sclera: Conjunctivae normal.  Cardiovascular:     Rate and Rhythm: Normal rate and regular rhythm.     Heart sounds: No murmur heard.   Pulmonary:     Effort: Pulmonary effort is normal. No respiratory distress.     Breath sounds: Normal breath sounds. No wheezing or rhonchi.  Abdominal:     Palpations: Abdomen is soft.     Tenderness: There is no abdominal tenderness. There is no guarding or rebound.  Musculoskeletal:     Cervical back: Neck supple.  Skin:    General: Skin is warm and dry.     Findings: No rash.  Neurological:     General: No focal deficit present.     Mental Status: She is alert and oriented to person, place, and time.     Gait: Gait normal.  Psychiatric:        Mood and Affect: Mood normal.        Behavior: Behavior normal.      UC Treatments / Results  Labs (all labs ordered are listed, but only abnormal results are displayed) Labs Reviewed - No data to display  EKG   Radiology No results found.  Procedures Procedures (including critical care time)  Medications Ordered in UC Medications - No data to display  Initial Impression / Assessment and Plan / UC Course  I have reviewed the triage vital signs and the nursing  notes.  Pertinent labs & imaging results that were available during my care of the patient were reviewed by me and  considered in my medical decision making (see chart for details).   Viral URI with cough.  Patient declines COVID testing.  Instructed patient to continue using her Flonase.  Treating cough with Tessalon Perles.  Instructed her to follow-up with her PCP if her symptoms are not improving.  Patient agrees to plan of care.     Final Clinical Impressions(s) / UC Diagnoses   Final diagnoses:  Viral URI with cough     Discharge Instructions     Continue using the Flonase.  Take the prescribed Tessalon Perles as needed for cough.    Follow up with your primary care provider if your symptoms are not improving.       ED Prescriptions    Medication Sig Dispense Auth. Provider   benzonatate (TESSALON) 100 MG capsule Take 1 capsule (100 mg total) by mouth 3 (three) times daily as needed for cough. 21 capsule Sharion Balloon, NP     PDMP not reviewed this encounter.   Sharion Balloon, NP 05/23/20 [phone]

## 2020-05-23 NOTE — ED Triage Notes (Signed)
Pt is here with a sinus infection, facial pain, hoarse voice since Saturday, pt has taken Flonase to relieve discomfort.

## 2020-05-23 NOTE — Discharge Instructions (Signed)
Continue using the Flonase.  Take the prescribed Tessalon Perles as needed for cough.    Follow up with your primary care provider if your symptoms are not improving.

## 2020-05-25 ENCOUNTER — Encounter: Payer: Self-pay | Admitting: Internal Medicine

## 2020-05-25 ENCOUNTER — Telehealth (INDEPENDENT_AMBULATORY_CARE_PROVIDER_SITE_OTHER): Payer: PPO | Admitting: Internal Medicine

## 2020-05-25 DIAGNOSIS — Z20822 Contact with and (suspected) exposure to covid-19: Secondary | ICD-10-CM | POA: Diagnosis not present

## 2020-05-25 DIAGNOSIS — J0100 Acute maxillary sinusitis, unspecified: Secondary | ICD-10-CM | POA: Diagnosis not present

## 2020-05-25 DIAGNOSIS — Z03818 Encounter for observation for suspected exposure to other biological agents ruled out: Secondary | ICD-10-CM | POA: Diagnosis not present

## 2020-05-25 MED ORDER — LEVOFLOXACIN 500 MG PO TABS: 500.0000 mg | ORAL_TABLET | Freq: Every day | ORAL | 0 refills | Status: DC

## 2020-05-25 NOTE — Progress Notes (Signed)
No fever since Saturday night.

## 2020-05-25 NOTE — Assessment & Plan Note (Addendum)
Has been treating for viral URI for 5 days with no improvement.  Now has maxillary and frontal sinus pain,  levaquin prescribed.  STRONGLY urged to have COVID testing done , which was done and was negative

## 2020-05-25 NOTE — Progress Notes (Signed)
Virtual Visit via [place]  This visit type was conducted due to national recommendations for restrictions regarding the COVID-19 pandemic (e.g. social distancing).  This format is felt to be most appropriate for this patient at this time.  All issues noted in this document were discussed and addressed.  No physical exam was performed (except for noted visual exam findings with Video Visits).   I connected with@ on 05/25/20 at  3:30 PM EDT by a video enabled telemedicine application  and verified that I am speaking with the correct person using two identifiers. Location patient: home Location provider: work or home office Persons participating in the virtual visit: patient, provider  I discussed the limitations, risks, security and privacy concerns of performing an evaluation and management service by telephone and the availability of in person appointments. I also discussed with the patient that there may be a patient responsible charge related to this service. The patient expressed understanding and agreed to proceed.  Reason for visit: respiratory infection   HPI:  Sinus congestion since Saturday,  Started coughing on  Sunday . No wheezing or dyspnea.  Sputum is productive of yellow sputum.  Headache since Saturday: taking Flonase and tylenol  And afrin All sinuses on face are tender to palpation. Felt feverish.  EVALUATED by Evisit on June 20,  Requested abx but refused ,  Then  at urgent care on June 21,  Declined COVID TESTING.  Told she had a viral URI and advised to follow up with PCP    has not had COVID vaccine.      ROS: See pertinent positives and negatives per HPI.  Past Medical History:  Diagnosis Date   Allergy    Cancer ([place]) 2009   basal cell on forehead   Cataract    Glaucoma    Osteoporosis     Past Surgical History:  Procedure Laterality Date   APPENDECTOMY  1975   CATARACT EXTRACTION, BILATERAL  2016   [place]    Family History  Adopted: Yes  Problem Relation Age of Onset   Colon cancer Neg Hx     SOCIAL HX:  reports that she quit smoking about 49 years ago. Her smoking use included cigarettes. She quit after 4.00 years of use. She has never used smokeless tobacco. She reports that she does not drink alcohol and does not use drugs.   Current Outpatient Medications:    fluticasone (FLONASE) 50 MCG/ACT nasal spray, Place 2 sprays into both nostrils daily., Disp: 16 g, Rfl: 6   timolol (TIMOPTIC) 0.5 % ophthalmic solution, Place 1 drop into the left eye 2 (two) times daily. , Disp: , Rfl:    benzonatate (TESSALON) 100 MG capsule, Take 1 capsule (100 mg total) by mouth 3 (three) times daily as needed for cough. (Patient not taking: Reported on 05/25/2020), Disp: 21 capsule, Rfl: 0   levofloxacin (LEVAQUIN) 500 MG tablet, Take 1 tablet (500 mg total) by mouth daily., Disp: 7 tablet, Rfl: 0  Current Facility-Administered Medications:    0.9 %  sodium chloride infusion, 500 mL, Intravenous, Continuous, Pyrtle, Lajuan Lines, MD  EXAM:  VITALS per patient if applicable:  GENERAL: alert, oriented, appears well and in no acute distress  HEENT: atraumatic, conjunttiva clear, no obvious abnormalities on inspection of external nose and ears  NECK: normal movements of the head and neck  LUNGS: on inspection no signs  of respiratory distress, breathing rate appears normal, no obvious gross SOB, gasping or wheezing  CV: no obvious cyanosis  MS: moves all visible extremities without noticeable abnormality  PSYCH/NEURO: pleasant and cooperative, no obvious depression or anxiety, speech and thought processing grossly intact  ASSESSMENT AND PLAN:  Discussed the following assessment and plan:  Acute maxillary sinusitis, recurrence not specified  Sinusitis, acute maxillary Has been treating for viral URI for 5 days with no improvement.   Now has maxillary and frontal sinus pain,  levaquin prescribed.  STRONGLY urged to have COVID testing done , which was done and was negative     I discussed the assessment and treatment plan with the patient. The patient was provided an opportunity to ask questions and all were answered. The patient agreed with the plan and demonstrated an understanding of the instructions.   The patient was advised to call back or seek an in-person evaluation if the symptoms worsen or if the condition fails to improve as anticipated.  I provided 25 minutes of non-face-to-face time during this encounter.   Crecencio Mc, MD

## 2020-06-07 DIAGNOSIS — Z85828 Personal history of other malignant neoplasm of skin: Secondary | ICD-10-CM | POA: Diagnosis not present

## 2020-06-07 DIAGNOSIS — L821 Other seborrheic keratosis: Secondary | ICD-10-CM | POA: Diagnosis not present

## 2020-06-07 DIAGNOSIS — X32XXXA Exposure to sunlight, initial encounter: Secondary | ICD-10-CM | POA: Diagnosis not present

## 2020-06-07 DIAGNOSIS — L814 Other melanin hyperpigmentation: Secondary | ICD-10-CM | POA: Diagnosis not present

## 2020-06-07 DIAGNOSIS — Z08 Encounter for follow-up examination after completed treatment for malignant neoplasm: Secondary | ICD-10-CM | POA: Diagnosis not present

## 2020-06-07 DIAGNOSIS — L570 Actinic keratosis: Secondary | ICD-10-CM | POA: Diagnosis not present

## 2020-07-21 ENCOUNTER — Other Ambulatory Visit: Payer: Self-pay

## 2020-07-21 ENCOUNTER — Encounter: Payer: Self-pay | Admitting: Internal Medicine

## 2020-07-21 ENCOUNTER — Ambulatory Visit (INDEPENDENT_AMBULATORY_CARE_PROVIDER_SITE_OTHER): Payer: PPO | Admitting: Internal Medicine

## 2020-07-21 VITALS — BP 118/78 | HR 68 | Temp 98.2°F | Resp 14 | Ht 60.0 in | Wt 100.4 lb

## 2020-07-21 DIAGNOSIS — M810 Age-related osteoporosis without current pathological fracture: Secondary | ICD-10-CM

## 2020-07-21 DIAGNOSIS — T733XXA Exhaustion due to excessive exertion, initial encounter: Secondary | ICD-10-CM | POA: Diagnosis not present

## 2020-07-21 DIAGNOSIS — Z23 Encounter for immunization: Secondary | ICD-10-CM | POA: Diagnosis not present

## 2020-07-21 DIAGNOSIS — R5383 Other fatigue: Secondary | ICD-10-CM | POA: Diagnosis not present

## 2020-07-21 DIAGNOSIS — Z1231 Encounter for screening mammogram for malignant neoplasm of breast: Secondary | ICD-10-CM | POA: Diagnosis not present

## 2020-07-21 DIAGNOSIS — E785 Hyperlipidemia, unspecified: Secondary | ICD-10-CM

## 2020-07-21 DIAGNOSIS — Z0000 Encounter for general adult medical examination without abnormal findings: Secondary | ICD-10-CM | POA: Diagnosis not present

## 2020-07-21 LAB — CBC WITH DIFFERENTIAL/PLATELET
Basophils Absolute: 0 10*3/uL (ref 0.0–0.1)
Basophils Relative: 0.8 % (ref 0.0–3.0)
Eosinophils Absolute: 0.2 10*3/uL (ref 0.0–0.7)
Eosinophils Relative: 4.1 % (ref 0.0–5.0)
HCT: 42.3 % (ref 36.0–46.0)
Hemoglobin: 14 g/dL (ref 12.0–15.0)
Lymphocytes Relative: 35.6 % (ref 12.0–46.0)
Lymphs Abs: 1.6 10*3/uL (ref 0.7–4.0)
MCHC: 33.1 g/dL (ref 30.0–36.0)
MCV: 93.5 fl (ref 78.0–100.0)
Monocytes Absolute: 0.6 10*3/uL (ref 0.1–1.0)
Monocytes Relative: 14 % — ABNORMAL HIGH (ref 3.0–12.0)
Neutro Abs: 2.1 10*3/uL (ref 1.4–7.7)
Neutrophils Relative %: 45.5 % (ref 43.0–77.0)
Platelets: 233 10*3/uL (ref 150.0–400.0)
RBC: 4.52 Mil/uL (ref 3.87–5.11)
RDW: 13.3 % (ref 11.5–15.5)
WBC: 4.6 10*3/uL (ref 4.0–10.5)

## 2020-07-21 LAB — COMPREHENSIVE METABOLIC PANEL
ALT: 14 U/L (ref 0–35)
AST: 18 U/L (ref 0–37)
Albumin: 4.2 g/dL (ref 3.5–5.2)
Alkaline Phosphatase: 94 U/L (ref 39–117)
BUN: 15 mg/dL (ref 6–23)
CO2: 32 mEq/L (ref 19–32)
Calcium: 9.5 mg/dL (ref 8.4–10.5)
Chloride: 101 mEq/L (ref 96–112)
Creatinine, Ser: 0.77 mg/dL (ref 0.40–1.20)
GFR: 73.55 mL/min (ref >60.00)
Glucose, Bld: 98 mg/dL (ref 70–99)
Potassium: 4.1 mEq/L (ref 3.5–5.1)
Sodium: 140 mEq/L (ref 135–145)
Total Bilirubin: 0.6 mg/dL (ref 0.2–1.2)
Total Protein: 7 g/dL (ref 6.0–8.3)

## 2020-07-21 LAB — LIPID PANEL
Cholesterol: 215 mg/dL — ABNORMAL HIGH (ref 0–200)
HDL: 60.1 mg/dL (ref >39.00)
LDL Cholesterol: 130 mg/dL — ABNORMAL HIGH (ref 0–99)
NonHDL: 154.58
Total CHOL/HDL Ratio: 4
Triglycerides: 123 mg/dL (ref 0.0–149.0)
VLDL: 24.6 mg/dL (ref 0.0–40.0)

## 2020-07-21 LAB — TSH: TSH: 4.36 u[IU]/mL (ref 0.35–4.50)

## 2020-07-21 LAB — VITAMIN B12: Vitamin B-12: 508 pg/mL (ref 211–911)

## 2020-07-21 MED ORDER — TETANUS-DIPHTH-ACELL PERTUSSIS 5-2.5-18.5 LF-MCG/0.5 IM SUSP: 0.5000 mL | Freq: Once | INTRAMUSCULAR | 0 refills | Status: AC

## 2020-07-21 NOTE — Assessment & Plan Note (Signed)

## 2020-07-21 NOTE — Assessment & Plan Note (Addendum)
Reviewed prior DEXA with patient.  She contiues to defer treatment.  No history of fractures. Exercises regularly .  Reviewed calcium and vitamin D needs

## 2020-07-21 NOTE — Progress Notes (Signed)
Patient ID: Nicole Nixon, female    DOB: 1946-09-17  Age: 74 y.o. MRN: [id number]  The patient is here for annual peventive examination and management of other chronic and acute problems.  This visit occurred during the SARS-CoV-2 public health emergency.  Safety protocols were in place, including screening questions prior to the visit, additional usage of staff PPE, and extensive cleaning of exam room while observing appropriate contact time as indicated for disinfecting solutions.     Patient has received NO  doses of the available COVID 19 vaccines due to concern about the effect of non FDA approved mRNA vaccines on future pregnancies .   Patient continues to mask when outside of the home except when walking in yard or at safe distances from others .  Patient denies any change in mood or development of unhealthy behaviors resuting from the pandemic's restriction of activities and socialization.    The risk factors are reflected in the social history.  The roster of all physicians providing medical care to patient - is listed in the Snapshot section of the chart.  Activities of daily living:  The patient is 100% independent in all ADLs: dressing, toileting, feeding as well as independent mobility  Home safety : The patient has smoke detectors in the home. They wear seatbelts.  There are no firearms at home. There is no violence in the home.   There is no risks for hepatitis, STDs or HIV. There is no   history of blood transfusion. They have no travel history to infectious disease endemic areas of the world.  The patient has seen their dentist in the last six month. They have seen their eye doctor in the last year. They admit to slight hearing difficulty with regard to whispered voices and some television programs.  They have deferred audiologic testing in the last year.  They do not  have excessive sun exposure. Discussed the need for sun protection: hats, long sleeves and use of sunscreen  if there is significant sun exposure.   Diet: the importance of a healthy diet is discussed. They do have a healthy diet.  The benefits of regular aerobic exercise were discussed. She exercises daily . Walks 2 miles daily    Depression screen: there are no signs or vegative symptoms of depression- irritability, change in appetite, anhedonia, sadness/tearfullness.  Cognitive assessment: the patient manages all their financial and personal affairs and is actively engaged. They could relate day,date,year and events; recalled 2/3 objects at 3 minutes; performed clock-face test normally.  The following portions of the patient's history were reviewed and updated as appropriate: allergies, current medications, past family history, past medical history,  past surgical history, past social history  and problem list.  Visual acuity was not assessed per patient preference since she has regular follow up with her ophthalmologist. Hearing and body mass index were assessed and reviewed.   During the course of the visit the patient was educated and counseled about appropriate screening and preventive services including : fall prevention , diabetes screening, nutrition counseling, colorectal cancer screening, and recommended immunizations.    CC: The primary encounter diagnosis was Hyperlipidemia LDL goal <130. Diagnoses of Fatigue due to excessive exertion, initial encounter, Breast cancer screening by mammogram, Osteoporosis, post-menopausal, Encounter for preventive health examination, and Fatigue, unspecified type were also pertinent to this visit.  She has no complaints or issues other than new onset fatigue .  Denies dyspnea,  Chest pain and leg pain.  Sleeping well.  History Tristan has a past medical history of Allergy, Cancer ([place]) (2009), Cataract, Glaucoma, and Osteoporosis.   She has a past surgical history that includes Appendectomy (1975); Tonsillectomy and adenoidectomy (1958); Cesarean section  (1978); Dilation and curettage of uterus (1975); and Cataract extraction, bilateral (2016).   Her family history is not on file. She was adopted.She reports that she quit smoking about 49 years ago. Her smoking use included cigarettes. She quit after 4.00 years of use. She has never used smokeless tobacco. She reports that she does not drink alcohol and does not use drugs.  Outpatient Medications Prior to Visit  Medication Sig Dispense Refill  . fluticasone (FLONASE) 50 MCG/ACT nasal spray Place 2 sprays into both nostrils daily. 16 g 6  . timolol (TIMOPTIC) 0.5 % ophthalmic solution Place 1 drop into the left eye 2 (two) times daily.     . benzonatate (TESSALON) 100 MG capsule Take 1 capsule (100 mg total) by mouth 3 (three) times daily as needed for cough. (Patient not taking: Reported on 05/25/2020) 21 capsule 0  . levofloxacin (LEVAQUIN) 500 MG tablet Take 1 tablet (500 mg total) by mouth daily. (Patient not taking: Reported on 07/21/2020) 7 tablet 0   Facility-Administered Medications Prior to Visit  Medication Dose Route Frequency Provider Last Rate Last Admin  . 0.9 %  sodium chloride infusion  500 mL Intravenous Continuous Pyrtle, Lajuan Lines, MD        Review of Systems   Patient denies headache, fevers, malaise, unintentional weight loss, skin rash, eye pain, sinus congestion and sinus pain, sore throat, dysphagia,  hemoptysis , cough, dyspnea, wheezing, chest pain, palpitations, orthopnea, edema, abdominal pain, nausea, melena, diarrhea, constipation, flank pain, dysuria, hematuria, urinary  Frequency, nocturia, numbness, tingling, seizures,  Focal weakness, Loss of consciousness,  Tremor, insomnia, depression, anxiety, and suicidal ideation.      Objective:  BP 118/78 (BP Location: Left Arm, Patient Position: Sitting, Cuff Size: Normal)   Pulse 68   Temp 98.2 F (36.8 C) (Oral)   Resp 14   Ht 5' (1.524 m)   Wt 100 lb 6.4 oz (45.5 kg)   SpO2 96%   BMI 19.61 kg/m   Physical  Exam  General appearance: alert, cooperative and appears stated age Head: Normocephalic, without obvious abnormality, atraumatic Eyes: conjunctivae/corneas clear. PERRL, EOM's intact. Fundi benign. Ears: normal TM's and external ear canals both ears Nose: Nares normal. Septum midline. Mucosa normal. No drainage or sinus tenderness. Throat: lips, mucosa, and tongue normal; teeth and gums normal Neck: no adenopathy, no carotid bruit, no JVD, supple, symmetrical, trachea midline and thyroid not enlarged, symmetric, no tenderness/mass/nodules Lungs: clear to auscultation bilaterally Breasts: normal appearance, no masses or tenderness Heart: regular rate and rhythm, S1, S2 normal, no murmur, click, rub or gallop Abdomen: soft, non-tender; bowel sounds normal; no masses,  no organomegaly Extremities: extremities normal, atraumatic, no cyanosis or edema Pulses: 2+ and symmetric Skin: Skin color, texture, turgor normal. No rashes or lesions Neurologic: Alert and oriented X 3, normal strength and tone. Normal symmetric reflexes. Normal coordination and gait.    Assessment & Plan:   Problem List Items Addressed This Visit      Unprioritized   Encounter for preventive health examination    age appropriate education and counseling updated, referrals for preventative services and immunizations addressed, dietary and smoking counseling addressed, most recent labs reviewed.  I have personally reviewed and have noted:  1) the patient's medical and social history 2) The pt's use  of alcohol, tobacco, and illicit drugs 3) The patient's current medications and supplements 4) Functional ability including ADL's, fall risk, home safety risk, hearing and visual impairment 5) Diet and physical activities 6) Evidence for depression or mood disorder 7) The patient's height, weight, and BMI have been recorded in the chart  I have made referrals, and provided counseling and education based on review of the  above      Fatigue    Etiology unclear.  Will screen for thyroid, anemia,  hepatic and renal insufficiency, B12 deficiency       Relevant Orders   CBC with Differential/Platelet   Vitamin B12   TSH   Hyperlipidemia LDL goal <130 - Primary    Mild,  Last year's  10 YR RISK was 5%   using the [facility].  Repeat labs pending.   Lab Results  Component Value Date   CHOL 195 08/11/2019   HDL 55.00 08/11/2019   LDLCALC 125 (H) 08/11/2019   TRIG 78.0 08/11/2019   CHOLHDL 4 08/11/2019         Relevant Orders   Comprehensive metabolic panel   Lipid panel   Osteoporosis, post-menopausal    Reviewed prior DEXA with patient.  She contiues to defer treatment.  No history of fractures. Exercises regularly .  Reviewed calcium and vitamin D needs       Other Visit Diagnoses    Breast cancer screening by mammogram       Relevant Orders   MM 3D SCREEN BREAST BILATERAL      I have discontinued Bryah M. Defina's benzonatate and levofloxacin. I am also having her start on Tdap. Additionally, I am having her maintain her timolol and fluticasone. We will continue to administer sodium chloride.  Meds ordered this encounter  Medications  . Tdap (BOOSTRIX) 5-2.5-18.5 LF-MCG/0.5 injection    Sig: Inject 0.5 mLs into the muscle once for 1 dose.    Dispense:  0.5 mL    Refill:  0    Medications Discontinued During This Encounter  Medication Reason  . benzonatate (TESSALON) 100 MG capsule   . levofloxacin (LEVAQUIN) 500 MG tablet     Follow-up: No follow-ups on file.   Crecencio Mc, MD

## 2020-07-21 NOTE — Patient Instructions (Addendum)
Your annual mammogram has been ordered and is due in  late September .  You are encouraged (required) to call to make your appointment at [facility] [phone]  You received the Pneumovax vaccine (your last Pneumonia vaccine ever unless a new one is created!)  You are due for your ten year  Booster of the tetanus-diptheria-pertussis vaccine (TDaP) but you can get it for less $$$ at a local pharmacy with the script I have provided you.   I would separate your pneumonia vaccine from your COVID vaccine by 2 weeks,  And get your TDAP vaccine one month after you complete your COVID vaccine.     Health Maintenance After Age 65 After age 49, you are at a higher risk for certain long-term diseases and infections as well as injuries from falls. Falls are a major cause of broken bones and head injuries in people who are older than age 53. Getting regular preventive care can help to keep you healthy and well. Preventive care includes getting regular testing and making lifestyle changes as recommended by your health care provider. Talk with your health care provider about:  Which screenings and tests you should have. A screening is a test that checks for a disease when you have no symptoms.  A diet and exercise plan that is right for you. What should I know about screenings and tests to prevent falls? Screening and testing are the best ways to find a health problem early. Early diagnosis and treatment give you the best chance of managing medical conditions that are common after age 31. Certain conditions and lifestyle choices may make you more likely to have a fall. Your health care provider may recommend:  Regular vision checks. Poor vision and conditions such as cataracts can make you more likely to have a fall. If you wear glasses, make sure to get your prescription updated if your vision changes.  Medicine review. Work with your health care provider to regularly review all of the medicines you are  taking, including over-the-counter medicines. Ask your health care provider about any side effects that may make you more likely to have a fall. Tell your health care provider if any medicines that you take make you feel dizzy or sleepy.  Osteoporosis screening. Osteoporosis is a condition that causes the bones to get weaker. This can make the bones weak and cause them to break more easily.  Blood pressure screening. Blood pressure changes and medicines to control blood pressure can make you feel dizzy.  Strength and balance checks. Your health care provider may recommend certain tests to check your strength and balance while standing, walking, or changing positions.  Foot health exam. Foot pain and numbness, as well as not wearing proper footwear, can make you more likely to have a fall.  Depression screening. You may be more likely to have a fall if you have a fear of falling, feel emotionally low, or feel unable to do activities that you used to do.  Alcohol use screening. Using too much alcohol can affect your balance and may make you more likely to have a fall. What actions can I take to lower my risk of falls? General instructions  Talk with your health care provider about your risks for falling. Tell your health care provider if: ? You fall. Be sure to tell your health care provider about all falls, even ones that seem minor. ? You feel dizzy, sleepy, or off-balance.  Take over-the-counter and prescription medicines only as  told by your health care provider. These include any supplements.  Eat a healthy diet and maintain a healthy weight. A healthy diet includes low-fat dairy products, low-fat (lean) meats, and fiber from whole grains, beans, and lots of fruits and vegetables. Home safety  Remove any tripping hazards, such as rugs, cords, and clutter.  Install safety equipment such as grab bars in bathrooms and safety rails on stairs.  Keep rooms and walkways  well-lit. Activity   Follow a regular exercise program to stay fit. This will help you maintain your balance. Ask your health care provider what types of exercise are appropriate for you.  If you need a cane or walker, use it as recommended by your health care provider.  Wear supportive shoes that have nonskid soles. Lifestyle  Do not drink alcohol if your health care provider tells you not to drink.  If you drink alcohol, limit how much you have: ? 0-1 drink a day for women. ? 0-2 drinks a day for men.  Be aware of how much alcohol is in your drink. In the [place] one drink equals one typical bottle of beer (12 oz), one-half glass of wine (5 oz), or one shot of hard liquor (1 oz).  Do not use any products that contain nicotine or tobacco, such as cigarettes and e-cigarettes. If you need help quitting, ask your health care provider. Summary  Having a healthy lifestyle and getting preventive care can help to protect your health and wellness after age 62.  Screening and testing are the best way to find a health problem early and help you avoid having a fall. Early diagnosis and treatment give you the best chance for managing medical conditions that are more common for people who are older than age 69.  Falls are a major cause of broken bones and head injuries in people who are older than age 67. Take precautions to prevent a fall at home.  Work with your health care provider to learn what changes you can make to improve your health and wellness and to prevent falls. This information is not intended to replace advice given to you by your health care provider. Make sure you discuss any questions you have with your health care provider. Document Revised: 03/12/2019 Document Reviewed: 10/02/2017 Elsevier Patient Education  2020 [employer].

## 2020-07-21 NOTE — Assessment & Plan Note (Signed)
Etiology unclear.  Will screen for thyroid, anemia,  hepatic and renal insufficiency, B12 deficiency

## 2020-07-21 NOTE — Assessment & Plan Note (Signed)
Mild,  Last year's  10 YR RISK was 5%   using the FRC.  Repeat labs pending.   Lab Results  Component Value Date   CHOL 195 08/11/2019   HDL 55.00 08/11/2019   LDLCALC 125 (H) 08/11/2019   TRIG 78.0 08/11/2019   CHOLHDL 4 08/11/2019

## 2020-09-08 DIAGNOSIS — H401191 Primary open-angle glaucoma, unspecified eye, mild stage: Secondary | ICD-10-CM | POA: Diagnosis not present

## 2020-09-29 DIAGNOSIS — D1801 Hemangioma of skin and subcutaneous tissue: Secondary | ICD-10-CM | POA: Diagnosis not present

## 2020-09-29 DIAGNOSIS — L298 Other pruritus: Secondary | ICD-10-CM | POA: Diagnosis not present

## 2020-10-06 ENCOUNTER — Ambulatory Visit
Admission: RE | Admit: 2020-10-06 | Discharge: 2020-10-06 | Disposition: A | Discharge status: Home / Self Care | Payer: PPO | Source: Ambulatory Visit | Attending: Internal Medicine | Admitting: Internal Medicine

## 2020-10-06 ENCOUNTER — Other Ambulatory Visit: Payer: Self-pay

## 2020-10-06 DIAGNOSIS — Z1231 Encounter for screening mammogram for malignant neoplasm of breast: Secondary | ICD-10-CM | POA: Diagnosis not present

## 2021-03-09 DIAGNOSIS — H401131 Primary open-angle glaucoma, bilateral, mild stage: Secondary | ICD-10-CM | POA: Diagnosis not present

## 2021-06-07 DIAGNOSIS — L568 Other specified acute skin changes due to ultraviolet radiation: Secondary | ICD-10-CM | POA: Diagnosis not present

## 2021-06-07 DIAGNOSIS — Z85828 Personal history of other malignant neoplasm of skin: Secondary | ICD-10-CM | POA: Diagnosis not present

## 2021-06-07 DIAGNOSIS — Z872 Personal history of diseases of the skin and subcutaneous tissue: Secondary | ICD-10-CM | POA: Diagnosis not present

## 2021-06-07 DIAGNOSIS — D2261 Melanocytic nevi of right upper limb, including shoulder: Secondary | ICD-10-CM | POA: Diagnosis not present

## 2021-06-07 DIAGNOSIS — D2262 Melanocytic nevi of left upper limb, including shoulder: Secondary | ICD-10-CM | POA: Diagnosis not present

## 2021-06-07 DIAGNOSIS — Z09 Encounter for follow-up examination after completed treatment for conditions other than malignant neoplasm: Secondary | ICD-10-CM | POA: Diagnosis not present

## 2021-06-07 DIAGNOSIS — D225 Melanocytic nevi of trunk: Secondary | ICD-10-CM | POA: Diagnosis not present

## 2021-07-21 ENCOUNTER — Encounter: Payer: PPO | Admitting: Internal Medicine

## 2021-07-25 ENCOUNTER — Other Ambulatory Visit: Payer: Self-pay

## 2021-07-25 ENCOUNTER — Encounter: Payer: Self-pay | Admitting: Internal Medicine

## 2021-07-25 ENCOUNTER — Ambulatory Visit (INDEPENDENT_AMBULATORY_CARE_PROVIDER_SITE_OTHER): Payer: PPO | Admitting: Internal Medicine

## 2021-07-25 VITALS — BP 126/64 | HR 63 | Temp 96.1°F | Resp 14 | Ht 60.0 in | Wt 102.6 lb

## 2021-07-25 DIAGNOSIS — Z780 Asymptomatic menopausal state: Secondary | ICD-10-CM

## 2021-07-25 DIAGNOSIS — Z1231 Encounter for screening mammogram for malignant neoplasm of breast: Secondary | ICD-10-CM

## 2021-07-25 DIAGNOSIS — E785 Hyperlipidemia, unspecified: Secondary | ICD-10-CM

## 2021-07-25 DIAGNOSIS — Z0000 Encounter for general adult medical examination without abnormal findings: Secondary | ICD-10-CM | POA: Diagnosis not present

## 2021-07-25 DIAGNOSIS — E559 Vitamin D deficiency, unspecified: Secondary | ICD-10-CM

## 2021-07-25 LAB — COMPREHENSIVE METABOLIC PANEL
ALT: 13 U/L (ref 0–35)
AST: 19 U/L (ref 0–37)
Albumin: 4.1 g/dL (ref 3.5–5.2)
Alkaline Phosphatase: 83 U/L (ref 39–117)
BUN: 15 mg/dL (ref 6–23)
CO2: 30 mEq/L (ref 19–32)
Calcium: 9.5 mg/dL (ref 8.4–10.5)
Chloride: 99 mEq/L (ref 96–112)
Creatinine, Ser: 0.79 mg/dL (ref 0.40–1.20)
GFR: 74.07 mL/min (ref >60.00)
Glucose, Bld: 87 mg/dL (ref 70–99)
Potassium: 4.1 mEq/L (ref 3.5–5.1)
Sodium: 138 mEq/L (ref 135–145)
Total Bilirubin: 0.5 mg/dL (ref 0.2–1.2)
Total Protein: 6.8 g/dL (ref 6.0–8.3)

## 2021-07-25 LAB — VITAMIN D 25 HYDROXY (VIT D DEFICIENCY, FRACTURES): VITD: 32.97 ng/mL (ref 30.00–100.00)

## 2021-07-25 LAB — LIPID PANEL
Cholesterol: 188 mg/dL (ref 0–200)
HDL: 63.9 mg/dL (ref >39.00)
LDL Cholesterol: 110 mg/dL — ABNORMAL HIGH (ref 0–99)
NonHDL: 123.76
Total CHOL/HDL Ratio: 3
Triglycerides: 67 mg/dL (ref 0.0–149.0)
VLDL: 13.4 mg/dL (ref 0.0–40.0)

## 2021-07-25 MED ORDER — TETANUS-DIPHTH-ACELL PERTUSSIS 5-2.5-18.5 LF-MCG/0.5 IM SUSY: 0.5000 mL | PREFILLED_SYRINGE | Freq: Once | INTRAMUSCULAR | 0 refills | Status: AC

## 2021-07-25 MED ORDER — ZOSTER VAC RECOMB ADJUVANTED 50 MCG/0.5ML IM SUSR: 0.5000 mL | Freq: Once | INTRAMUSCULAR | 1 refills | Status: AC

## 2021-07-25 NOTE — Assessment & Plan Note (Signed)

## 2021-07-25 NOTE — Patient Instructions (Addendum)
I strongly recommend that you get the TDaP vaccine at [facility].  It is good for ten years for protection against tetanus,  And for lifetime protection against  diptheria and whooping cough     The ShingRx vaccine is now available in local pharmacies and is much more protective than Zostavaxs,  It is therefore ADVISED for all interested adults over 50 to prevent shingles.    Your annual mammogram and 5 year follow up bone density scan have been ordered.  You are encouraged (required) to call to make your appointment at [facility] [phone]

## 2021-07-25 NOTE — Progress Notes (Signed)
Patient ID: Nicole Nixon, female    DOB: [birth year]  Age: 75 y.o. MRN: [id number]  The patient is here for follow up and management of other chronic and acute problems.  This visit occurred during the SARS-CoV-2 public health emergency.  Safety protocols were in place, including screening questions prior to the visit, additional usage of staff PPE, and extensive cleaning of exam room while observing appropriate contact time as indicated for disinfecting solutions.     The risk factors are reflected in the social history.  The roster of all physicians providing medical care to patient - is listed in the Snapshot section of the chart.  Activities of daily living:  The patient is 100% independent in all ADLs: dressing, toileting, feeding as well as independent mobility  Home safety : The patient has smoke detectors in the home. They wear seatbelts.  There are no firearms at home. There is no violence in the home.   There is no risks for hepatitis, STDs or HIV. There is no   history of blood transfusion. They have no travel history to infectious disease endemic areas of the world.  The patient has seen their dentist in the last six month. They have seen their eye doctor in the last year. She denies  hearing difficulty with regard to whispered voices and some television programs.  They have deferred audiologic testing in the last year.  They do not  have excessive sun exposure. Discussed the need for sun protection: hats, long sleeves and use of sunscreen if there is significant sun exposure.   Diet: the importance of a healthy diet is discussed. They do have a healthy diet.  The benefits of regular aerobic exercise were discussed. She walks 4 times per week ,  20 minutes.   Depression screen: there are no signs or vegative symptoms of depression- irritability, change in appetite, anhedonia, sadness/tearfullness.  Cognitive assessment: the patient manages all their financial and personal  affairs and is actively engaged. They could relate day,date,year and events; recalled 2/3 objects at 3 minutes; performed clock-face test normally.  The following portions of the patient's history were reviewed and updated as appropriate: allergies, current medications, past family history, past medical history,  past surgical history, past social history  and problem list.  Visual acuity was not assessed per patient preference since she has regular follow up with her ophthalmologist. Hearing and body mass index were assessed and reviewed.   During the course of the visit the patient was educated and counseled about appropriate screening and preventive services including : fall prevention , diabetes screening, nutrition counseling, colorectal cancer screening, and recommended immunizations.    CC: The primary encounter diagnosis was Hyperlipidemia LDL goal <130. Diagnoses of Vitamin D deficiency, Postmenopausal estrogen deficiency, Breast cancer screening by mammogram, and Encounter for preventive health examination were also pertinent to this visit.  History Viann has a past medical history of Allergy, Cancer ([place]) (2009), Cataract, Glaucoma, and Osteoporosis.   She has a past surgical history that includes Appendectomy (1975); Tonsillectomy and adenoidectomy (1958); Cesarean section (1978); Dilation and curettage of uterus (1975); and Cataract extraction, bilateral (2016).   Her family history is not on file. She was adopted.She reports that she quit smoking about 50 years ago. Her smoking use included cigarettes. She has never used smokeless tobacco. She reports that she does not drink alcohol and does not use drugs.  Outpatient Medications Prior to Visit  Medication Sig Dispense Refill   fluticasone (FLONASE) 50 MCG/ACT  nasal spray Place 2 sprays into both nostrils daily. 16 g 6   timolol (TIMOPTIC) 0.5 % ophthalmic solution Place 1 drop into the left eye 2 (two) times daily.       Facility-Administered Medications Prior to Visit  Medication Dose Route Frequency Provider Last Rate Last Admin   0.9 %  sodium chloride infusion  500 mL Intravenous Continuous Pyrtle, Lajuan Lines, MD        Review of Systems  Patient denies headache, fevers, malaise, unintentional weight loss, skin rash, eye pain, sinus congestion and sinus pain, sore throat, dysphagia,  hemoptysis , cough, dyspnea, wheezing, chest pain, palpitations, orthopnea, edema, abdominal pain, nausea, melena, diarrhea, constipation, flank pain, dysuria, hematuria, urinary  Frequency, nocturia, numbness, tingling, seizures,  Focal weakness, Loss of consciousness,  Tremor, insomnia, depression, anxiety, and suicidal ideation.     Objective:  BP 126/64 (BP Location: Left Arm, Patient Position: Sitting, Cuff Size: Normal)   Pulse 63   Temp (!) 96.1 F (35.6 C) (Temporal)   Resp 14   Ht 5' (1.524 m)   Wt 102 lb 9.6 oz (46.5 kg)   SpO2 95%   BMI 20.04 kg/m     Physical Exam  General appearance: alert, cooperative and appears stated age Head: Normocephalic, without obvious abnormality, atraumatic Eyes: conjunctivae/corneas clear. PERRL, EOM's intact. Fundi benign. Ears: normal TM's and external ear canals both ears Nose: Nares normal. Septum midline. Mucosa normal. No drainage or sinus tenderness. Throat: lips, mucosa, and tongue normal; teeth and gums normal Neck: no adenopathy, no carotid bruit, no JVD, supple, symmetrical, trachea midline and thyroid not enlarged, symmetric, no tenderness/mass/nodules Lungs: clear to auscultation bilaterally Breasts: normal appearance, no masses or tenderness Heart: regular rate and rhythm, S1, S2 normal, no murmur, click, rub or gallop Abdomen: soft, non-tender; bowel sounds normal; no masses,  no organomegaly Extremities: extremities normal, atraumatic, no cyanosis or edema Pulses: 2+ and symmetric Skin: Skin color, texture, turgor normal. No rashes or lesions Neurologic:  Alert and oriented X 3, normal strength and tone. Normal symmetric reflexes. Normal coordination and gait.      Assessment & Plan:   Problem List Items Addressed This Visit       Unprioritized   Encounter for preventive health examination    age appropriate education and counseling updated, referrals for preventative services and immunizations addressed, dietary and smoking counseling addressed, most recent labs reviewed.  I have personally reviewed and have noted:   1) the patient's medical and social history 2) The pt's use of alcohol, tobacco, and illicit drugs 3) The patient's current medications and supplements 4) Functional ability including ADL's, fall risk, home safety risk, hearing and visual impairment 5) Diet and physical activities 6) Evidence for depression or mood disorder 7) The patient's height, weight, and BMI have been recorded in the chart   I have made referrals, and provided counseling and education based on review of the above      Hyperlipidemia LDL goal <130 - Primary   Relevant Orders   Lipid panel (Completed)   Comprehensive metabolic panel (Completed)   Other Visit Diagnoses     Vitamin D deficiency       Relevant Orders   VITAMIN D 25 Hydroxy (Vit-D Deficiency, Fractures) (Completed)   Postmenopausal estrogen deficiency       Relevant Orders   DG Bone Density   Breast cancer screening by mammogram       Relevant Orders   MM DIGITAL SCREENING BILATERAL  Meds ordered this encounter  Medications   Tdap (BOOSTRIX) 5-2.5-18.5 LF-MCG/0.5 injection    Sig: Inject 0.5 mLs into the muscle once for 1 dose.    Dispense:  0.5 mL    Refill:  0   Zoster Vaccine Adjuvanted [facility]) injection    Sig: Inject 0.5 mLs into the muscle once for 1 dose.    Dispense:  1 each    Refill:  1    There are no discontinued medications.  Follow-up: No follow-ups on file.   Crecencio Mc, MD

## 2021-10-17 ENCOUNTER — Other Ambulatory Visit: Payer: Self-pay

## 2021-10-17 ENCOUNTER — Ambulatory Visit
Admission: RE | Admit: 2021-10-17 | Discharge: 2021-10-17 | Disposition: A | Discharge status: Home / Self Care | Payer: PPO | Source: Ambulatory Visit | Attending: Internal Medicine | Admitting: Internal Medicine

## 2021-10-17 DIAGNOSIS — Z1231 Encounter for screening mammogram for malignant neoplasm of breast: Secondary | ICD-10-CM

## 2021-10-17 DIAGNOSIS — M810 Age-related osteoporosis without current pathological fracture: Secondary | ICD-10-CM | POA: Diagnosis not present

## 2021-10-17 DIAGNOSIS — Z780 Asymptomatic menopausal state: Secondary | ICD-10-CM | POA: Insufficient documentation

## 2021-10-24 ENCOUNTER — Encounter: Payer: Self-pay | Admitting: Internal Medicine

## 2021-10-31 ENCOUNTER — Telehealth: Payer: Self-pay | Admitting: *Deleted

## 2021-10-31 DIAGNOSIS — M810 Age-related osteoporosis without current pathological fracture: Secondary | ICD-10-CM

## 2021-10-31 NOTE — Telephone Encounter (Signed)
Needs prolia see My chart message

## 2021-10-31 NOTE — Telephone Encounter (Signed)
Submitted approval

## 2021-11-13 NOTE — Telephone Encounter (Signed)
Benefit still in process.

## 2021-12-12 NOTE — Telephone Encounter (Signed)
Patient insurance would not pay entire amount left patient owing 20 % for year 2022 I have filed under 2023  will reverify and see if payment decreases. If not patient will require substitution for Prolia due to cost

## 2021-12-19 NOTE — Telephone Encounter (Signed)
Patient cannot afford asking for substitute for Prolia patient has 20% co pay.

## 2021-12-29 NOTE — Telephone Encounter (Signed)
Patient cannot afford asking for substitute for Prolia patient has 20% co pay.

## 2021-12-29 NOTE — Telephone Encounter (Signed)
There are no other options available other than Forteo,  which requires patient give herself an shot every day.  Referring to endocrinology to discuss other infusion options,  (Reclast infusions )

## 2021-12-29 NOTE — Addendum Note (Signed)
Addended by: Crecencio Mc on: 12/29/2021 03:34 PM   Modules accepted: Orders

## 2022-01-11 ENCOUNTER — Encounter: Payer: Self-pay | Admitting: *Deleted

## 2022-01-11 NOTE — Telephone Encounter (Signed)
Sent mychart message

## 2022-01-18 ENCOUNTER — Encounter: Payer: Self-pay | Admitting: Internal Medicine

## 2022-02-09 ENCOUNTER — Encounter: Payer: Self-pay | Admitting: Internal Medicine

## 2022-02-13 ENCOUNTER — Encounter: Payer: Self-pay | Admitting: Internal Medicine

## 2022-02-13 ENCOUNTER — Ambulatory Visit (INDEPENDENT_AMBULATORY_CARE_PROVIDER_SITE_OTHER): Payer: PPO | Admitting: Internal Medicine

## 2022-02-13 ENCOUNTER — Ambulatory Visit (INDEPENDENT_AMBULATORY_CARE_PROVIDER_SITE_OTHER): Payer: PPO

## 2022-02-13 ENCOUNTER — Other Ambulatory Visit: Payer: Self-pay

## 2022-02-13 VITALS — BP 120/64 | HR 54 | Temp 98.4°F | Ht 60.0 in | Wt 107.6 lb

## 2022-02-13 DIAGNOSIS — M810 Age-related osteoporosis without current pathological fracture: Secondary | ICD-10-CM

## 2022-02-13 DIAGNOSIS — M160 Bilateral primary osteoarthritis of hip: Secondary | ICD-10-CM | POA: Diagnosis not present

## 2022-02-13 DIAGNOSIS — M25551 Pain in right hip: Secondary | ICD-10-CM | POA: Insufficient documentation

## 2022-02-13 DIAGNOSIS — R7301 Impaired fasting glucose: Secondary | ICD-10-CM | POA: Diagnosis not present

## 2022-02-13 DIAGNOSIS — G8929 Other chronic pain: Secondary | ICD-10-CM

## 2022-02-13 DIAGNOSIS — R5383 Other fatigue: Secondary | ICD-10-CM | POA: Diagnosis not present

## 2022-02-13 DIAGNOSIS — E785 Hyperlipidemia, unspecified: Secondary | ICD-10-CM | POA: Diagnosis not present

## 2022-02-13 DIAGNOSIS — E559 Vitamin D deficiency, unspecified: Secondary | ICD-10-CM | POA: Diagnosis not present

## 2022-02-13 LAB — COMPREHENSIVE METABOLIC PANEL
ALT: 14 U/L (ref 0–35)
AST: 18 U/L (ref 0–37)
Albumin: 4.2 g/dL (ref 3.5–5.2)
Alkaline Phosphatase: 82 U/L (ref 39–117)
BUN: 22 mg/dL (ref 6–23)
CO2: 30 mEq/L (ref 19–32)
Calcium: 9.4 mg/dL (ref 8.4–10.5)
Chloride: 102 mEq/L (ref 96–112)
Creatinine, Ser: 0.86 mg/dL (ref 0.40–1.20)
GFR: 66.64 mL/min (ref >60.00)
Glucose, Bld: 100 mg/dL — ABNORMAL HIGH (ref 70–99)
Potassium: 4.3 mEq/L (ref 3.5–5.1)
Sodium: 138 mEq/L (ref 135–145)
Total Bilirubin: 0.4 mg/dL (ref 0.2–1.2)
Total Protein: 6.7 g/dL (ref 6.0–8.3)

## 2022-02-13 LAB — VITAMIN D 25 HYDROXY (VIT D DEFICIENCY, FRACTURES): VITD: 32.49 ng/mL (ref 30.00–100.00)

## 2022-02-13 MED ORDER — MELOXICAM 7.5 MG PO TABS: 7.5000 mg | ORAL_TABLET | Freq: Every day | ORAL | 3 refills | Status: DC

## 2022-02-13 NOTE — Progress Notes (Signed)
? ?Subjective:  ?Patient ID: Nicole Nixon, female    DOB: 11-11-1946  Age: 76 y.o. MRN: [id number] ? ?CC: The primary encounter diagnosis was Hyperlipidemia LDL goal <130. Diagnoses of Fatigue, unspecified type, Impaired fasting glucose, Chronic pain of right hip, Acute right hip pain, Osteoporosis without current pathological fracture, unspecified osteoporosis type, Vitamin D deficiency, and Osteoporosis, post-menopausal were also pertinent to this visit. ? ? ?This visit occurred during the SARS-CoV-2 public health emergency.  Safety protocols were in place, including screening questions prior to the visit, additional usage of staff PPE, and extensive cleaning of exam room while observing appropriate contact time as indicated for disinfecting solutions.   ? ?HPI ?Nicole Nixon presents for  ?Chief Complaint  ?Patient presents with  ? Follow-up  ?  Follow up to discuss Prolia  ? ? ?1) osteoporosis: diagnosed over 10 years ago,  has failed trials of bisphosponates  SERMS and PTH.  Has declined Prolia Scheduled to received Reclast in May.   ? ?2) has been having right hip pain in the inguinal region for the past several months.  Feels like it is going to "catch" when she changes position,  pivots  ? ? ?Outpatient Medications Prior to Visit  ?Medication Sig Dispense Refill  ? fluticasone (FLONASE) 50 MCG/ACT nasal spray Place 2 sprays into both nostrils daily. 16 g 6  ? timolol (TIMOPTIC) 0.5 % ophthalmic solution Place 1 drop into the left eye 2 (two) times daily.     ? ?Facility-Administered Medications Prior to Visit  ?Medication Dose Route Frequency Provider Last Rate Last Admin  ? 0.9 %  sodium chloride infusion  500 mL Intravenous Continuous Pyrtle, Lajuan Lines, MD      ? ? ?Review of Systems; ? ?Patient denies headache, fevers, malaise, unintentional weight loss, skin rash, eye pain, sinus congestion and sinus pain, sore throat, dysphagia,  hemoptysis , cough, dyspnea, wheezing, chest pain, palpitations,  orthopnea, edema, abdominal pain, nausea, melena, diarrhea, constipation, flank pain, dysuria, hematuria, urinary  Frequency, nocturia, numbness, tingling, seizures,  Focal weakness, Loss of consciousness,  Tremor, insomnia, depression, anxiety, and suicidal ideation.   ? ? ? ?Objective:  ?BP 120/64 (BP Location: Left Arm, Patient Position: Sitting, Cuff Size: Normal)   Pulse (!) 54   Temp 98.4 ?F (36.9 ?C) (Oral)   Ht 5' (1.524 m)   Wt 107 lb 9.6 oz (48.8 kg)   SpO2 97%   BMI 21.01 kg/m?  ? ?BP Readings from Last 3 Encounters:  ?02/13/22 120/64  ?07/25/21 126/64  ?07/21/20 118/78  ? ? ?Wt Readings from Last 3 Encounters:  ?02/13/22 107 lb 9.6 oz (48.8 kg)  ?07/25/21 102 lb 9.6 oz (46.5 kg)  ?07/21/20 100 lb 6.4 oz (45.5 kg)  ? ? ?General appearance: alert, cooperative and appears stated age ?Ears: normal TM's and external ear canals both ears ?Throat: lips, mucosa, and tongue normal; teeth and gums normal ?Neck: no adenopathy, no carotid bruit, supple, symmetrical, trachea midline and thyroid not enlarged, symmetric, no tenderness/mass/nodules ?Back: symmetric, no curvature. ROM normal. No CVA tenderness. ?Lungs: clear to auscultation bilaterally ?Heart: regular rate and rhythm, S1, S2 normal, no murmur, click, rub or gallop ?Abdomen: soft, non-tender; bowel sounds normal; no masses,  no organomegaly ?MSK:  full ROM bilateral hips,  some pain with abduction on the right.  Mild Ischial tuberosity pain to palpation  ?Pulses: 2+ and symmetric ?Skin: Skin color, texture, turgor normal. No rashes or lesions ?Lymph nodes: Cervical, supraclavicular, and axillary nodes normal. ? ?  Lab Results  ?Component Value Date  ? HGBA1C 6.1 08/11/2019  ? ? ?Lab Results  ?Component Value Date  ? CREATININE 0.79 07/25/2021  ? CREATININE 0.77 07/21/2020  ? CREATININE 0.77 08/11/2019  ? ? ?Lab Results  ?Component Value Date  ? WBC 4.6 07/21/2020  ? HGB 14.0 07/21/2020  ? HCT 42.3 07/21/2020  ? PLT 233.0 07/21/2020  ? GLUCOSE 87  07/25/2021  ? CHOL 188 07/25/2021  ? TRIG 67.0 07/25/2021  ? HDL 63.90 07/25/2021  ? LDLCALC 110 (H) 07/25/2021  ? ALT 13 07/25/2021  ? AST 19 07/25/2021  ? NA 138 07/25/2021  ? K 4.1 07/25/2021  ? CL 99 07/25/2021  ? CREATININE 0.79 07/25/2021  ? BUN 15 07/25/2021  ? CO2 30 07/25/2021  ? TSH 4.36 07/21/2020  ? HGBA1C 6.1 08/11/2019  ? ? ?DG Bone Density ? ?Result Date: 10/17/2021 ?EXAM: DUAL X-RAY ABSORPTIOMETRY (DXA) FOR BONE MINERAL DENSITY IMPRESSION: Your patient Nicole Nixon completed a BMD test on 10/17/2021 using the [place] (software version: 14.10) manufactured by [employer]. The following summarizes the results of our evaluation. Technologist: SCE PATIENT BIOGRAPHICAL: Name: Nicole, Nixon Patient ID: [id number] Birth Date: 11-29-1946 Height: 60.0 in. Gender: Female Exam Date: 10/17/2021 Weight: 102.0 lbs. Indications: Advanced Age, Caucasian, Postmenopausal Fractures: Treatments: DENSITOMETRY RESULTS: Site      Region     Measured Date Measured Age WHO Classification Young Adult T-score BMD         %Change vs. Previous Significant Change (*) AP Spine L1-L4 10/17/2022 73.9 Osteoporosis -4.4 0.644 g/cm2 -9.9% Yes AP Spine L1-L4 10/06/2015 67.8 Osteoporosis -3.9 0.715 g/cm2 -8.2% Yes AP Spine L1-L4 02/15/2010 62.2 Osteoporosis -3.4 0.779 g/cm2 - - DualFemur Total Left 10/17/2021 73.9 Osteoporosis -3.1 0.620 g/cm2 -2.5% - DualFemur Total Left 10/06/2015 67.8 Osteoporosis -3.0 0.636 g/cm2 -6.5% Yes DualFemur Total Left 02/15/2010 62.2 Osteoporosis -2.6 0.680 g/cm2 - - DualFemur Total Mean 10/17/2021 73.9 Osteoporosis -3.0 0.627 g/cm2 -2.9% Yes DualFemur Total Mean 10/06/2015 67.8 Osteoporosis -2.9 0.646 g/cm2 -3.9% Yes DualFemur Total Mean 02/15/2010 62.2 Osteoporosis -2.7 0.672 g/cm2 - - ASSESSMENT: The BMD measured at AP Spine L1-L4 is 0.644 g/cm2 with a T-score of -4.4. This patient is considered osteoporotic according to [facility]) criteria. The  scan quality is good. Compared with prior study, there has been a significant decrease in the spine. Compared with prior study, there has been a significant decrease in the total hip. World Pharmacologist [facility]) criteria for post-menopausal, Caucasian Women: Normal:                   T-score at or above -1 SD Osteopenia/low bone mass: T-score between -1 and -2.5 SD Osteoporosis:             T-score at or below -2.5 SD RECOMMENDATIONS: 1. All patients should optimize calcium and vitamin D intake. 2. Consider FDA-approved medical therapies in postmenopausal women and men aged 59 years and older, based on the following: a. A hip or vertebral(clinical or morphometric) fracture b. T-score < -2.5 at the femoral neck or spine after appropriate evaluation to exclude secondary causes c. Low bone mass (T-score between -1.0 and -2.5 at the femoral neck or spine) and a 10-year probability of a hip fracture > 3% or a 10-year probability of a major osteoporosis-related fracture > 20% based on the [place]-adapted WHO algorithm 3. Clinician judgment and/or patient preferences may indicate treatment for people with 10-year fracture probabilities above or below  these levels FOLLOW-UP: People with diagnosed cases of osteoporosis or at high risk for fracture should have regular bone mineral density tests. For patients eligible for Medicare, routine testing is allowed once every 2 years. The testing frequency can be increased to one year for patients who have rapidly progressing disease, those who are receiving or discontinuing medical therapy to restore bone mass, or have additional risk factors. I have reviewed this report, and agree with the above findings. [facility] Electronically Signed   By: Rolm Baptise M.D.   On: 10/17/2021 19:27  ? ?MM 3D SCREEN BREAST BILATERAL ? ?Result Date: 10/17/2021 ?CLINICAL DATA:  Screening. EXAM: DIGITAL SCREENING BILATERAL MAMMOGRAM WITH TOMOSYNTHESIS AND CAD TECHNIQUE: Bilateral  screening digital craniocaudal and mediolateral oblique mammograms were obtained. Bilateral screening digital breast tomosynthesis was performed. The images were evaluated with computer-aided detection. COMPARISON:  Previ

## 2022-02-13 NOTE — Assessment & Plan Note (Signed)
She contiues to defer treatment with Prolia due to cost (OOP $200/injection).  No history of fractures. Exercises regularly .  Reviewed calcium and vitamin D needs.  Sending to endocrinology for consideratio nof Reclast  ?

## 2022-02-13 NOTE — Assessment & Plan Note (Signed)
She has osteoporosis and is worried about an impending fracture.  She also has right ischial  tuberosity pain to palpation and with sitting.  Has   No SI joint pain on exam.  Sending for hip/pelvic films  meloxicam and tylenol prescribed  ?

## 2022-02-13 NOTE — Patient Instructions (Signed)
I am prescribing Meloxicam 7.5 mg daily INSTEAD OF ALEVE OR MOTRIN/ADVIL  for your right hip pain  ? ? ?You can ADD up to 2000 mg of acetominophen (tylenol) every day safely  In divided doses (500 mg every 6 hours  Or 1000 mg every 12 hours.)  ? ? ?IF YOUR  X RAY SHOWS MODERATE OR SEVERE DEGENERATIVE CHANGES,  I WILL RECOMMEND THAT YOU SEE ORTHOPEDICS  ?

## 2022-02-16 ENCOUNTER — Encounter: Payer: Self-pay | Admitting: Internal Medicine

## 2022-04-04 ENCOUNTER — Ambulatory Visit: Payer: PPO | Admitting: Internal Medicine

## 2022-04-04 ENCOUNTER — Encounter: Payer: Self-pay | Admitting: Internal Medicine

## 2022-04-04 ENCOUNTER — Telehealth: Payer: Self-pay | Admitting: Internal Medicine

## 2022-04-04 VITALS — BP 120/80 | HR 64 | Ht 60.0 in | Wt 106.0 lb

## 2022-04-04 DIAGNOSIS — Q782 Osteopetrosis: Secondary | ICD-10-CM

## 2022-04-04 LAB — BASIC METABOLIC PANEL
BUN: 13 mg/dL (ref 6–23)
CO2: 30 mEq/L (ref 19–32)
Calcium: 9.5 mg/dL (ref 8.4–10.5)
Chloride: 102 mEq/L (ref 96–112)
Creatinine, Ser: 0.92 mg/dL (ref 0.40–1.20)
GFR: 61.4 mL/min (ref >60.00)
Glucose, Bld: 128 mg/dL — ABNORMAL HIGH (ref 70–99)
Potassium: 4 mEq/L (ref 3.5–5.1)
Sodium: 138 mEq/L (ref 135–145)

## 2022-04-04 LAB — ALBUMIN: Albumin: 4.4 g/dL (ref 3.5–5.2)

## 2022-04-04 LAB — T4, FREE: Free T4: 0.85 ng/dL (ref 0.60–1.60)

## 2022-04-04 LAB — TSH: TSH: 1.22 u[IU]/mL (ref 0.35–5.50)

## 2022-04-04 LAB — VITAMIN D 25 HYDROXY (VIT D DEFICIENCY, FRACTURES): VITD: 33.69 ng/mL (ref 30.00–100.00)

## 2022-04-04 NOTE — Telephone Encounter (Signed)
Hi Brandy ,  ? ?Can you please start the process for Evenity on this patient for severe osteoporosis.? ? ? ?Thanks a lot  ? ? ?Mack Guise, MD ? ?[facility]  ?[facility] ?[address] ?[place], [place] [address] ?Phone: [phone] ?FAX: [phone] ? ?

## 2022-04-04 NOTE — Progress Notes (Signed)
? ? ?Name: Nicole Nixon  ?MRN/ DOB: [id number], August 05, 1946    ?Age/ Sex: 76 y.o., female   ? ?PCP: Crecencio Mc, MD   ?Reason for Endocrinology Evaluation: Osteoporosis  ?   ?Date of Initial Endocrinology Evaluation: 04/04/2022   ? ? ?HPI: ?[name] is a 76 y.o. female with a past medical history of IBS, osteoporosis. The patient presented for initial endocrinology clinic visit on 04/04/2022 for consultative assistance with her osteoporosis.  ? ?Pt was diagnosed with osteoporosis: 2016 with a T score of -3.9 at the [facility] ? ?Menarche at age : 13/13 ?Menopausal at age : age 71 ?Fracture Hx: no ?Hx of HRT: no ?FH of osteoporosis or hip fracture: no- she is adopted  ?Prior Hx of anti-estrogenic therapy :no  ?Prior Hx of anti-resorptive therapy : was on Fosamax in 2013. Forteo caused her to be really sick ( with nausea) for 11 month  ? ? ?Used to follow up at the [facility]  ?Tobacco use-no  ?ETOH use- no  ? ?Denies GERD  ?Denies constipation but has chronic diarrhea  ?Has occasional arthralgia  ?Denies hx of CVA or MI  ? ? ?  ? ? ? ? ?She has not been taking calcium recently  ?Vitamin D3 1000 iu daily  ?She has lactose intolerance and avoids certain dairy products  ? ? ? ? ?HISTORY:  ?Past Medical History:  ?Past Medical History:  ?Diagnosis Date  ? Allergy   ? Cancer [facility]) 2009  ? 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 Cataract   ? Glaucoma   ? Osteoporosis   ? ?Past Surgical History:  ?Past Surgical History:  ?Procedure Laterality Date  ? APPENDECTOMY  1975  ? CATARACT EXTRACTION, BILATERAL  2016  ? [place]  ? [place] OF UTERUS  1975  ? [place]  ?  ?Social History:  reports that she quit smoking about 51 years ago. Her smoking use included cigarettes. She has never used smokeless tobacco. She reports that she does not drink alcohol and does not use drugs. ?Family History: family history is not on file. She was adopted. ? ? ?HOME  MEDICATIONS: ?Allergies as of 04/04/2022   ? ?   Reactions  ? Codeine Other (See Comments)  ? Makes pt very agitated and anxious.   ? Erythromycin Other (See Comments)  ? Sever stomach cramping.    ? Penicillins Rash  ? Influenza Vac Split Quad   ? Allergic to eggs,  Needs exemption letter annually   ? Prednisone   ? ?  ? ?  ?Medication List  ?  ? ?  ? Accurate as of Apr 04, 2022  1:24 PM. If you have any questions, ask your nurse or doctor.  ?  ?  ? ?  ? ?fluticasone 50 MCG/ACT nasal spray ?Commonly known as: FLONASE ?Place 2 sprays into both nostrils daily. ?  ?meloxicam 7.5 MG tablet ?Commonly known as: MOBIC ?Take 1 tablet (7.5 mg total) by mouth daily. For hip pain ?  ?timolol 0.5 % ophthalmic solution ?Commonly known as: TIMOPTIC ?Place 1 drop into the left eye 2 (two) times daily. ?  ? ?  ?  ? ? ?REVIEW OF SYSTEMS: ?A comprehensive ROS was conducted with the patient and is negative except as per HPI  ? ? ?OBJECTIVE:  ?VS: BP 120/80 (BP Location: Left Arm, Patient Position: Sitting, Cuff Size: Small)   Pulse 64   Ht  5' (1.524 m)   Wt 106 lb (48.1 kg)   SpO2 98%   BMI 20.70 kg/m?   ? ?Wt Readings from Last 3 Encounters:  ?04/04/22 106 lb (48.1 kg)  ?02/13/22 107 lb 9.6 oz (48.8 kg)  ?07/25/21 102 lb 9.6 oz (46.5 kg)  ? ? ? ?EXAM: ?General: Pt appears well and is in NAD  ?Neck: General: Supple without adenopathy. ?Thyroid: Thyroid size normal.  No goiter or nodules appreciated.   ?Lungs: Clear with good BS bilat with no rales, rhonchi, or wheezes  ?Heart: Auscultation: RRR.  ?Abdomen: Normoactive bowel sounds, soft, nontender, without masses or organomegaly palpable  ?Mental Status: Judgment, insight: Intact ?Orientation: Oriented to time, place, and person ?Mood and affect: No depression, anxiety, or agitation  ? ? ? ?DATA REVIEWED: ? ? Latest Reference Range & Units 04/04/22 12:11  ?Sodium 135 - 145 mEq/L 138  ?Potassium 3.5 - 5.1 mEq/L 4.0  ?Chloride 96 - 112 mEq/L 102  ?CO2 19 - 32 mEq/L 30  ?Glucose 70 -  99 mg/dL 128 (H)  ?BUN 6 - 23 mg/dL 13  ?Creatinine 0.40 - 1.20 mg/dL 0.92  ?Calcium 8.4 - 10.5 mg/dL 9.5  ?Albumin 3.5 - 5.2 g/dL 4.4  ?GFR >60.00 mL/min 61.40  ?VITD 30.00 - 100.00 ng/mL 33.69  ? ? Latest Reference Range & Units 04/04/22 12:11  ?Glucose 70 - 99 mg/dL 128 (H)  ?PTH, Intact 16 - 77 pg/mL 51  ?TSH 0.35 - 5.50 uIU/mL 1.22  ?T4,Free(Direct) 0.60 - 1.60 ng/dL 0.85  ? ? ?DXA 10/17/2021 ? ? ? AP Spine L1-L4 10/17/2021 73.9 Osteoporosis -4.4 0.644 g/cm2 -9.9% ?Yes ?AP Spine L1-L4 10/06/2015 67.8 Osteoporosis -3.9 0.715 g/cm2 -8.2% ?Yes ?AP Spine L1-L4 02/15/2010 62.2 Osteoporosis -3.4 0.779 g/cm2 - - ?  ?DualFemur Total Left 10/17/2021 73.9 Osteoporosis -3.1 0.620 g/cm2 ?-2.5% - ?DualFemur Total Left 10/06/2015 67.8 Osteoporosis -3.0 0.636 g/cm2 ?-6.5% Yes ?DualFemur Total Left 02/15/2010 62.2 Osteoporosis -2.6 0.680 g/cm2 - ?- ?  ?DualFemur Total Mean 10/17/2021 73.9 Osteoporosis -3.0 0.627 g/cm2 ?-2.9% Yes ?DualFemur Total Mean 10/06/2015 67.8 Osteoporosis -2.9 0.646 g/cm2 ?-3.9% Yes ?DualFemur Total Mean 02/15/2010 62.2 Osteoporosis -2.7 0.672 g/cm2 ? ?ASSESSMENT/PLAN/RECOMMENDATIONS:  ? ?Severe osteoporosis: ? ?-Patient with a T score of -4.4 at the [facility] ?-She has history of intolerance to Forteo that she took for 11 months..  She has also been on alendronate in the past without benefit per patient. ?-She has no history of fragility fractures ?-We will proceed with secondary causes by checking TFTs, PTH, and 24-hour urinary collection for calcium ?-We emphasized the importance of optimizing calcium and vitamin D intake ?-Due to chronic GI issues I have recommended Citracal due to better absorption profile than calcium carbonate ?-I have recommended Evenity as the first option given the severity of osteoporosis, we did discuss that she is a low risk for CVA/CAD events, given that no prior history of any ?-We discussed Prolia as a secondary option, we discussed with the risk of local reaction and  hypocalcemia ?-Patient in agreement with proceeding with Evenity we will start this process ? ? ? ?Medications : ?Citracal 1200 mg daily ?Vitamin D3 1000 IU daily ? ? ?Follow-up in 6 months ? ? ?Signed electronically by: ?Abby Nena Jordan, MD ? ?[facility]  ?[facility] ?[address] ?[place], [place] [address] ?Phone: [phone] ?FAX: [phone] ? ? ?CC: ?Crecencio Mc, MD ?[address] ?[place] [address] ?Phone: [phone] ?Fax: [phone] ? ? ?  Return to Endocrinology clinic as below: ?Future Appointments  ?Date Time Provider [place]  ?07/25/2022  8:30 AM Crecencio Mc, MD [facility]  ?10/03/2022  1:40 PM Basheer Molchan, Melanie Crazier, MD [facility] None  ?  ? ? ? ? ? ?

## 2022-04-04 NOTE — Patient Instructions (Addendum)
-   Citracal between 1000-1200 mg daily  ?- Continue Vitamin D 1000 iu daily  ? ?24-Hour Urine Collection ? ?You will be collecting your urine for a 24-hour period of time. ?Your timer starts with your first urine of the morning (For example - If you first pee at [place], your timer will start at [place]) ?Throw away your first urine of the morning ?Collect your urine every time you pee for the next 24 hours ?STOP your urine collection 24 hours after you started the collection (For example - You would stop at 9AM the day after you started) ? ?

## 2022-04-05 LAB — PARATHYROID HORMONE, INTACT (NO CA): PTH: 51 pg/mL (ref 16–77)

## 2022-04-05 NOTE — Telephone Encounter (Signed)
Evenity VOB initiated via [email or web address]  New start  

## 2022-04-12 NOTE — Telephone Encounter (Signed)
Pt ready for scheduling on or after 04/12/22 ? ?Out-of-pocket cost due at time of visit: $443 ? ?Primary: Health[employer] Adv ?Evenity co-insurance: 20% (approximately $418) ?Admin fee co-insurance: 20% (approximately $25) ? ?Secondary: n/a ?Evenity co-insurance:  ?Admin fee co-insurance:  ? ?Deductible: does not apply ? ?Prior Auth: not required ?PA# ?Valid:  ? ?** This summary of benefits is an estimation of the patient's out-of-pocket cost. Exact cost may vary based on individual plan coverage.  ? ?

## 2022-04-17 ENCOUNTER — Other Ambulatory Visit: Payer: PPO

## 2022-04-17 DIAGNOSIS — Q782 Osteopetrosis: Secondary | ICD-10-CM

## 2022-04-18 LAB — CREATININE, URINE, 24 HOUR: Creatinine, 24H Ur: 0.65 g/(24.h) (ref 0.50–2.15)

## 2022-04-18 LAB — CALCIUM, URINE, 24 HOUR: Calcium, 24H Urine: 116 mg/24 h

## 2022-04-18 LAB — EXTRA URINE SPECIMEN

## 2022-05-24 ENCOUNTER — Encounter: Payer: Self-pay | Admitting: Internal Medicine

## 2022-05-24 DIAGNOSIS — J22 Unspecified acute lower respiratory infection: Secondary | ICD-10-CM | POA: Diagnosis not present

## 2022-05-24 NOTE — Telephone Encounter (Signed)
I called and spoke with patient to offer her VV at 4:30p tomorrow with Dr. Caryl Bis if she was okay to wait. She stated that she was actually going to head over to [facility] clinic walk-in today to see if she can get some relief.

## 2022-06-07 DIAGNOSIS — D2272 Melanocytic nevi of left lower limb, including hip: Secondary | ICD-10-CM | POA: Diagnosis not present

## 2022-06-07 DIAGNOSIS — D225 Melanocytic nevi of trunk: Secondary | ICD-10-CM | POA: Diagnosis not present

## 2022-06-07 DIAGNOSIS — L821 Other seborrheic keratosis: Secondary | ICD-10-CM | POA: Diagnosis not present

## 2022-06-07 DIAGNOSIS — Z85828 Personal history of other malignant neoplasm of skin: Secondary | ICD-10-CM | POA: Diagnosis not present

## 2022-06-07 DIAGNOSIS — D2261 Melanocytic nevi of right upper limb, including shoulder: Secondary | ICD-10-CM | POA: Diagnosis not present

## 2022-06-14 DIAGNOSIS — H16223 Keratoconjunctivitis sicca, not specified as Sjogren's, bilateral: Secondary | ICD-10-CM | POA: Diagnosis not present

## 2022-06-14 DIAGNOSIS — H401130 Primary open-angle glaucoma, bilateral, stage unspecified: Secondary | ICD-10-CM | POA: Diagnosis not present

## 2022-06-29 ENCOUNTER — Other Ambulatory Visit: Payer: Self-pay

## 2022-07-24 ENCOUNTER — Encounter: Payer: Self-pay | Admitting: Internal Medicine

## 2022-07-25 ENCOUNTER — Encounter: Payer: Self-pay | Admitting: Internal Medicine

## 2022-07-25 ENCOUNTER — Ambulatory Visit (INDEPENDENT_AMBULATORY_CARE_PROVIDER_SITE_OTHER): Payer: PPO | Admitting: Internal Medicine

## 2022-07-25 VITALS — BP 114/70 | HR 45 | Temp 98.1°F | Resp 14 | Ht 60.0 in | Wt 105.6 lb

## 2022-07-25 DIAGNOSIS — E785 Hyperlipidemia, unspecified: Secondary | ICD-10-CM

## 2022-07-25 DIAGNOSIS — M810 Age-related osteoporosis without current pathological fracture: Secondary | ICD-10-CM | POA: Diagnosis not present

## 2022-07-25 DIAGNOSIS — F5104 Psychophysiologic insomnia: Secondary | ICD-10-CM

## 2022-07-25 DIAGNOSIS — R7301 Impaired fasting glucose: Secondary | ICD-10-CM

## 2022-07-25 DIAGNOSIS — Z1231 Encounter for screening mammogram for malignant neoplasm of breast: Secondary | ICD-10-CM | POA: Diagnosis not present

## 2022-07-25 DIAGNOSIS — R5383 Other fatigue: Secondary | ICD-10-CM

## 2022-07-25 DIAGNOSIS — Z950 Presence of cardiac pacemaker: Secondary | ICD-10-CM | POA: Insufficient documentation

## 2022-07-25 DIAGNOSIS — I441 Atrioventricular block, second degree: Secondary | ICD-10-CM | POA: Diagnosis not present

## 2022-07-25 DIAGNOSIS — R001 Bradycardia, unspecified: Secondary | ICD-10-CM | POA: Diagnosis not present

## 2022-07-25 LAB — COMPREHENSIVE METABOLIC PANEL
ALT: 13 U/L (ref 0–35)
AST: 17 U/L (ref 0–37)
Albumin: 4.2 g/dL (ref 3.5–5.2)
Alkaline Phosphatase: 85 U/L (ref 39–117)
BUN: 23 mg/dL (ref 6–23)
CO2: 29 mEq/L (ref 19–32)
Calcium: 9.3 mg/dL (ref 8.4–10.5)
Chloride: 104 mEq/L (ref 96–112)
Creatinine, Ser: 0.87 mg/dL (ref 0.40–1.20)
GFR: 65.51 mL/min (ref >60.00)
Glucose, Bld: 98 mg/dL (ref 70–99)
Potassium: 4.3 mEq/L (ref 3.5–5.1)
Sodium: 140 mEq/L (ref 135–145)
Total Bilirubin: 0.6 mg/dL (ref 0.2–1.2)
Total Protein: 6.7 g/dL (ref 6.0–8.3)

## 2022-07-25 LAB — CBC WITH DIFFERENTIAL/PLATELET
Basophils Absolute: 0 10*3/uL (ref 0.0–0.1)
Basophils Relative: 0.8 % (ref 0.0–3.0)
Eosinophils Absolute: 0.1 10*3/uL (ref 0.0–0.7)
Eosinophils Relative: 2.4 % (ref 0.0–5.0)
HCT: 42.8 % (ref 36.0–46.0)
Hemoglobin: 14.2 g/dL (ref 12.0–15.0)
Lymphocytes Relative: 35.2 % (ref 12.0–46.0)
Lymphs Abs: 1.7 10*3/uL (ref 0.7–4.0)
MCHC: 33.2 g/dL (ref 30.0–36.0)
MCV: 92.5 fl (ref 78.0–100.0)
Monocytes Absolute: 0.7 10*3/uL (ref 0.1–1.0)
Monocytes Relative: 13.9 % — ABNORMAL HIGH (ref 3.0–12.0)
Neutro Abs: 2.4 10*3/uL (ref 1.4–7.7)
Neutrophils Relative %: 47.7 % (ref 43.0–77.0)
Platelets: 200 10*3/uL (ref 150.0–400.0)
RBC: 4.63 Mil/uL (ref 3.87–5.11)
RDW: 13.1 % (ref 11.5–15.5)
WBC: 5 10*3/uL (ref 4.0–10.5)

## 2022-07-25 LAB — LIPID PANEL
Cholesterol: 205 mg/dL — ABNORMAL HIGH (ref 0–200)
HDL: 66.2 mg/dL (ref >39.00)
LDL Cholesterol: 121 mg/dL — ABNORMAL HIGH (ref 0–99)
NonHDL: 138.49
Total CHOL/HDL Ratio: 3
Triglycerides: 89 mg/dL (ref 0.0–149.0)
VLDL: 17.8 mg/dL (ref 0.0–40.0)

## 2022-07-25 LAB — HEMOGLOBIN A1C: Hgb A1c MFr Bld: 6 % (ref 4.6–6.5)

## 2022-07-25 LAB — LDL CHOLESTEROL, DIRECT: Direct LDL: 120 mg/dL

## 2022-07-25 LAB — TSH: TSH: 1.83 u[IU]/mL (ref 0.35–5.50)

## 2022-07-25 NOTE — Assessment & Plan Note (Addendum)
Symptomatic,  with inappropriate response to walking.  (HR dropped to 39 with ambulation).  I have ordered and reviewed a 12 lead EKG and find that she has an AB block (Mobitz type 2)  With no prior EKG for comparison.  referiring to  Dr Rockey Situ for evaluation    Lab Results  Component Value Date   TSH 1.83 07/25/2022

## 2022-07-25 NOTE — Progress Notes (Unsigned)
Patient ID: EDMONIA GONSER, female    DOB: 1946-02-22  Age: 76 y.o. MRN: [id number]  The patient is here for follow up and  management of r chronic and acute problems.   The risk factors are reflected in the social history.  The roster of all physicians providing medical care to patient - is listed in the Snapshot section of the chart.  Activities of daily living:  The patient is 100% independent in all ADLs: dressing, toileting, feeding as well as independent mobility  Home safety : The patient has smoke detectors in the home. They wear seatbelts.  There are no firearms at home. There is no violence in the home.   There is no risks for hepatitis, STDs or HIV. There is no   history of blood transfusion. They have no travel history to infectious disease endemic areas of the world.  The patient has seen their dentist in the last six month. They have seen their eye doctor in the last year. They admit to slight hearing difficulty with regard to whispered voices and some television programs.  They have deferred audiologic testing in the last year.  They do not  have excessive sun exposure. Discussed the need for sun protection: hats, long sleeves and use of sunscreen if there is significant sun exposure.   Diet: the importance of a healthy diet is discussed. They do have a healthy diet.  The benefits of regular aerobic exercise were discussed. She walks 4 times per week ,  20 minutes.   Depression screen: there are no signs or vegative symptoms of depression- irritability, change in appetite, anhedonia, sadness/tearfullness.  Cognitive assessment: the patient manages all their financial and personal affairs and is actively engaged. They could relate day,date,year and events; recalled 2/3 objects at 3 minutes; performed clock-face test normally.  The following portions of the patient's history were reviewed and updated as appropriate: allergies, current medications, past family history, past  medical history,  past surgical history, past social history  and problem list.  Visual acuity was not assessed per patient preference since she has regular follow up with her ophthalmologist. Hearing and body mass index were assessed and reviewed.   During the course of the visit the patient was educated and counseled about appropriate screening and preventive services including : fall prevention , diabetes screening, nutrition counseling, colorectal cancer screening, and recommended immunizations.    CC: The primary encounter diagnosis was Encounter for screening mammogram for malignant neoplasm of breast. Diagnoses of Hyperlipidemia LDL goal <130, Fatigue, unspecified type, Impaired fasting glucose, Bradycardia with 41-50 beats per minute, and Bradycardia were also pertinent to this visit.   1) bradycardia:  rate has been dropping into the 40's  . Was noted during an urgent care  evaluation for sinusitis.  Denies chest pain and shortness of breath , denies presyncopal feelings. No history of long distance running or cycling  .  Walks for exercise ,  not daily.  Has gained 6 lbs over the past year (per patient) and notes increased appetite    History Brealyn has a past medical history of Allergy, Cancer ([place]) (2009), Cataract, Glaucoma, and Osteoporosis.   She has a past surgical history that includes Appendectomy (1975); Tonsillectomy and adenoidectomy (1958); Cesarean section (1978); Dilation and curettage of uterus (1975); and Cataract extraction, bilateral (2016).   Her family history is not on file. She was adopted.She reports that she quit smoking about 51 years ago. Her smoking use included cigarettes. She has never  used smokeless tobacco. She reports that she does not drink alcohol and does not use drugs.  Outpatient Medications Prior to Visit  Medication Sig Dispense Refill   fluticasone (FLONASE) 50 MCG/ACT nasal spray Place 2 sprays into both nostrils daily. 16 g 6   meloxicam  (MOBIC) 7.5 MG tablet Take 1 tablet (7.5 mg total) by mouth daily. For hip pain 30 tablet 3   RHOPRESSA 0.02 % SOLN SMARTSIG:1 Drop(s) In Eye(s) Every Night     timolol (TIMOPTIC) 0.5 % ophthalmic solution Place 1 drop into the left eye 2 (two) times daily.      Facility-Administered Medications Prior to Visit  Medication Dose Route Frequency Provider Last Rate Last Admin   0.9 %  sodium chloride infusion  500 mL Intravenous Continuous Pyrtle, Lajuan Lines, MD        Review of Systems  Objective:  BP 114/70 (BP Location: Left Arm, Patient Position: Sitting, Cuff Size: Normal)   Pulse (!) 45   Temp 98.1 F (36.7 C) (Oral)   Resp 14   Ht 5' (1.524 m)   Wt 105 lb 9.6 oz (47.9 kg)   SpO2 98%   BMI 20.62 kg/m   Physical Exam  Physical Exam   Assessment & Plan:   Problem List Items Addressed This Visit     Bradycardia with 41-50 beats per minute    Symptomatic,  with inappropriate response to walking.  (HR dropped to 39 with ambulation).  I have ordered and reviewed a 12 lead EKG and find that she has an AB block (Mobitz type 2)  With no prior EKG for comparison.  referiring to  Dr Rockey Situ for evaluation        Relevant Orders   Ambulatory referral to Cardiology   EKG 12-Lead (Completed)   Fatigue   Relevant Orders   CBC with Differential/Platelet   Hyperlipidemia LDL goal <130   Relevant Orders   Lipid Profile   Direct LDL   Other Visit Diagnoses     Encounter for screening mammogram for malignant neoplasm of breast    -  Primary   Relevant Orders   MM 3D SCREEN BREAST BILATERAL   Impaired fasting glucose       Relevant Orders   Comp Met (CMET)   HgB A1c   Bradycardia       Relevant Orders   TSH       I am having Carman M. Krichbaum maintain her timolol, fluticasone, meloxicam, and Rhopressa. We will continue to administer sodium chloride.  No orders of the defined types were placed in this encounter.   There are no discontinued medications.  Follow-up: No  follow-ups on file.   Crecencio Mc, MD

## 2022-07-25 NOTE — Patient Instructions (Signed)
If your thyroid function is not the culprit for your slow heart rate, I will proceed with the referral  to Dr Rockey Situ

## 2022-07-26 NOTE — Assessment & Plan Note (Addendum)
She contiues to defer treatment with Prolia due to cost (OOP $200/injection).  No history of fractures. Exercises regularly .  Reviewed calcium and vitamin D needs.  She was referred to endocrinology for consideration  of Reclast  And is scheduled to start Evenity pending prior authorization

## 2022-07-26 NOTE — Assessment & Plan Note (Addendum)
Averaging 6 to 6.5 hours  Per night ,  No daytime somnolence.  No anxiety. She declines pharmacotherapy

## 2022-09-10 ENCOUNTER — Ambulatory Visit: Payer: PPO | Attending: Cardiovascular Disease | Admitting: Cardiovascular Disease

## 2022-09-10 ENCOUNTER — Encounter: Payer: Self-pay | Admitting: Cardiovascular Disease

## 2022-09-10 ENCOUNTER — Ambulatory Visit (INDEPENDENT_AMBULATORY_CARE_PROVIDER_SITE_OTHER): Payer: PPO

## 2022-09-10 VITALS — BP 160/80 | HR 45 | Ht 60.0 in | Wt 108.1 lb

## 2022-09-10 DIAGNOSIS — I4430 Unspecified atrioventricular block: Secondary | ICD-10-CM | POA: Diagnosis not present

## 2022-09-10 DIAGNOSIS — R001 Bradycardia, unspecified: Secondary | ICD-10-CM | POA: Diagnosis not present

## 2022-09-10 DIAGNOSIS — I491 Atrial premature depolarization: Secondary | ICD-10-CM

## 2022-09-10 DIAGNOSIS — R0602 Shortness of breath: Secondary | ICD-10-CM | POA: Diagnosis not present

## 2022-09-10 NOTE — Patient Instructions (Addendum)
Medication Instructions:  No changes  If you need a refill on your cardiac medications before your next appointment, please call your pharmacy.    Lab work: No new labs needed   Testing/Procedures:  1) Echocardiogram: (for bradycardia, AV block) - Your physician has requested that you have an echocardiogram. Echocardiography is a painless test that uses sound waves to create images of your heart. It provides your doctor with information about the size and shape of your heart and how well your heart's chambers and valves are working. This procedure takes approximately one hour. There are no restrictions for this procedure. There is a possibility that an IV may need to be started during your test to inject an image enhancing agent. This is done to obtain more optimal pictures of your heart. Therefore we ask that you do at least drink some water prior to coming in to hydrate your veins.      2) Heart Monitor: (for bradycardia, AV block)  Length of Wear: 14 days  Your monitor will be mailed to your home address within 3-5 business days. However, if you have not received your monitor after 5 business days please send [place] a MyChart message or call the office at [phone], so we may follow up on this for you.   Your physician has recommended that you wear a Zio XT (heart) monitor.   This monitor is a medical device that records the heart's electrical activity. Doctors most often use these monitors to diagnose arrhythmias. Arrhythmias are problems with the speed or rhythm of the heartbeat. The monitor is a small device applied to your chest. You can wear one while you do your normal daily activities. While wearing this monitor if you have any symptoms to push the button and record what you felt. Once you have worn this monitor for the period of time provider prescribed (Usually 14 days), you will return the monitor device in the postage paid box. Once it is returned they will download the data  collected and provide [place] with a report which the provider will then review and we will call you with those results. Important tips:  Avoid showering during the first 24 hours of wearing the monitor. Avoid excessive sweating to help maximize wear time. Do not submerge the device, no hot tubs, and no swimming pools. Keep any lotions or oils away from the patch. After 24 hours you may shower with the patch on. Take brief showers with your back facing the shower head.  Do not remove patch once it has been placed because that will interrupt data and decrease adhesive wear time. Push the button when you have any symptoms and write down what you were feeling. Once you have completed wearing your monitor, remove and place into box which has postage paid and place in your outgoing mailbox.  If for some reason you have misplaced your box then call our office and we can provide another box and/or mail it off for you.     Follow-Up: At [facility], you and your health needs are our priority.  As part of our continuing mission to provide you with exceptional heart care, we have created designated Provider Care Teams.  These Care Teams include your primary Cardiologist (physician) and Advanced Practice Providers (APPs -  Physician Assistants and Nurse Practitioners) who all work together to provide you with the care you need, when you need it.  You will need a follow up appointment in 6 weeks  Providers on  your designated Care Team:   Murray Hodgkins, NP Christell Faith, PA-C Cadence Kathlen Mody, [place]  COVID-19 Vaccine Information can be found at: [email or web address] For questions related to vaccine distribution or appointments, please email [email or web address] or call [phone].    Echocardiogram An echocardiogram is a test that uses sound waves (ultrasound) to produce images of the heart. Images from an echocardiogram can provide important  information about: Heart size and shape. The size and thickness and movement of your heart's walls. Heart muscle function and strength. Heart valve function or if you have stenosis. Stenosis is when the heart valves are too narrow. If blood is flowing backward through the heart valves (regurgitation). A tumor or infectious growth around the heart valves. Areas of heart muscle that are not working well because of poor blood flow or injury from a heart attack. Aneurysm detection. An aneurysm is a weak or damaged part of an artery wall. The wall bulges out from the normal force of blood pumping through the body. Tell a health care provider about: Any allergies you have. All medicines you are taking, including vitamins, herbs, eye drops, creams, and over-the-counter medicines. Any blood disorders you have. Any surgeries you have had. Any medical conditions you have. Whether you are pregnant or may be pregnant. What are the risks? Generally, this is a safe test. However, problems may occur, including an allergic reaction to dye (contrast) that may be used during the test. What happens before the test? No specific preparation is needed. You may eat and drink normally. What happens during the test?  You will take off your clothes from the waist up and put on a hospital gown. Electrodes or electrocardiogram (ECG)patches may be placed on your chest. The electrodes or patches are then connected to a device that monitors your heart rate and rhythm. You will lie down on a table for an ultrasound exam. A gel will be applied to your chest to help sound waves pass through your skin. A handheld device, called a transducer, will be pressed against your chest and moved over your heart. The transducer produces sound waves that travel to your heart and bounce back (or "echo" back) to the transducer. These sound waves will be captured in real-time and changed into images of your heart that can be viewed on a  video monitor. The images will be recorded on a computer and reviewed by your health care provider. You may be asked to change positions or hold your breath for a short time. This makes it easier to get different views or better views of your heart. In some cases, you may receive contrast through an IV in one of your veins. This can improve the quality of the pictures from your heart. The procedure may vary among health care providers and hospitals. What can I expect after the test? You may return to your normal, everyday life, including diet, activities, and medicines, unless your health care provider tells you not to do that. Follow these instructions at home: It is up to you to get the results of your test. Ask your health care provider, or the department that is doing the test, when your results will be ready. Keep all follow-up visits. This is important. Summary An echocardiogram is a test that uses sound waves (ultrasound) to produce images of the heart. Images from an echocardiogram can provide important information about the size and shape of your heart, heart muscle function, heart valve function, and other possible heart problems. You do not need  to do anything to prepare before this test. You may eat and drink normally. After the echocardiogram is completed, you may return to your normal, everyday life, unless your health care provider tells you not to do that. This information is not intended to replace advice given to you by your health care provider. Make sure you discuss any questions you have with your health care provider. Document Revised: 08/02/2021 Document Reviewed: 07/12/2020 Elsevier Patient Education  [place].

## 2022-09-10 NOTE — Progress Notes (Signed)
Cardiology Office Note  Date:  09/10/2022   ID:  Nicole Nixon, DOB Oct 06, 1946, MRN [id number]  PCP:  Crecencio Mc, MD   Chief Complaint  Patient presents with   Other    CVA/Mobitz av block c/o low HR. Meds reviewed verbally with pt.    HPI:  Nicole Nixon is a 76 year old woman with past medical history of Cancer Remote smoking Osteoporosis Chronic insomnia Hyperlipidemia Who presents by referral from Dr. Derrel Nip for bradycardia, Mobitz type II second-degree AV block  Recent visit to primary care Seen by Dr. Derrel Nip July 25, 2022 Heart rate on that visit 45 bpm EKG obtained documenting sinus rhythm with blocked PACs in a bigeminal pattern Ventricular rate on that EKG 39 bpm  Reports that she feels relatively well overall, was active in the garden today Unable to exclude slight fatigue or shortness of breath on heavy exertion In general , change in symptoms has not been dramatic  Currently not on any medications that would lower her heart rate No longer on timolol eyedrops  Blood pressure typically well controlled on other visits with primary care Blood pressure on today's visit, even on recheck Currently not on blood pressure medication  EKG personally reviewed by myself on todays visit Sinus rhythm with ventricular rate 45 bpm, blocked PACs in bigeminal pattern  PMH:   has a past medical history of Allergy, Cancer ([place]) (2009), Cataract, Glaucoma, and Osteoporosis.  PSH:    Past Surgical History:  Procedure Laterality Date   APPENDECTOMY  1975   CATARACT EXTRACTION, BILATERAL  2016   CESAREAN SECTION  1978   DILATION AND CURETTAGE OF UTERUS  1975   TONSILLECTOMY AND ADENOIDECTOMY  1958    Current Outpatient Medications  Medication Sig Dispense Refill   fluticasone (FLONASE) 50 MCG/ACT nasal spray Place 2 sprays into both nostrils daily. 16 g 6   meloxicam (MOBIC) 7.5 MG tablet Take 1 tablet (7.5 mg total) by mouth daily. For hip pain 30 tablet 3    RHOPRESSA 0.02 % SOLN SMARTSIG:1 Drop(s) In Eye(s) Every Night     Current Facility-Administered Medications  Medication Dose Route Frequency Provider Last Rate Last Admin   0.9 %  sodium chloride infusion  500 mL Intravenous Continuous Pyrtle, Lajuan Lines, MD        Allergies:   Codeine, Erythromycin, Penicillins, Influenza vac split quad, and Prednisone   Social History:  The patient  reports that she quit smoking about 51 years ago. Her smoking use included cigarettes. She has never used smokeless tobacco. She reports that she does not drink alcohol and does not use drugs.   Family History:   family history is not on file. She was adopted.    Review of Systems: Review of Systems  Constitutional: Negative.   HENT: Negative.    Respiratory: Negative.    Cardiovascular: Negative.   Gastrointestinal: Negative.   Musculoskeletal: Negative.   Neurological: Negative.   Psychiatric/Behavioral: Negative.    All other systems reviewed and are negative.   PHYSICAL EXAM: VS:  BP (!) 160/80 (BP Location: Left Arm)   Pulse (!) 45   Ht 5' (1.524 m)   Wt 108 lb 2 oz (49 kg)   SpO2 98%   BMI 21.12 kg/m  , BMI Body mass index is 21.12 kg/m. GEN: Well nourished, well developed, in no acute distress HEENT: normal Neck: no JVD, carotid bruits, or masses Cardiac: Bradycardic, RRR; no murmurs, rubs, or gallops,no edema  Respiratory:  clear to  auscultation bilaterally, normal work of breathing GI: soft, nontender, nondistended, + BS MS: no deformity or atrophy Skin: warm and dry, no rash Neuro:  Strength and sensation are intact Psych: euthymic mood, full affect  Recent Labs: 07/25/2022: ALT 13; BUN 23; Creatinine, Ser 0.87; Hemoglobin 14.2; Platelets 200.0; Potassium 4.3; Sodium 140; TSH 1.83    Lipid Panel Lab Results  Component Value Date   CHOL 205 (H) 07/25/2022   HDL 66.20 07/25/2022   LDLCALC 121 (H) 07/25/2022   TRIG 89.0 07/25/2022      Wt Readings from Last 3 Encounters:   09/10/22 108 lb 2 oz (49 kg)  07/25/22 105 lb 9.6 oz (47.9 kg)  04/04/22 106 lb (48.1 kg)      ASSESSMENT AND PLAN:  Problem List Items Addressed This Visit   None Visit Diagnoses     Shortness of breath    -  Primary   Relevant Orders   EKG 12-Lead   ECHOCARDIOGRAM COMPLETE   Bradycardia       Relevant Orders   EKG 12-Lead   LONG TERM MONITOR (3-14 DAYS)   PAC (premature atrial contraction)       AV block          Symptomatic bradycardia EKG reviewed showing sinus rhythm with blocked PACs in a bigeminal pattern Reports that she is relatively asymptomatic, spent her day gardening, reasonable exercise tolerance Recommend a Zio monitor for further evaluation, to correlate her symptoms with arrhythmia No change to medications at this time Echocardiogram ordered to rule out structural heart disease   Total encounter time more than 50 minutes  Greater than 50% was spent in counseling and coordination of care with the patient    Signed, Esmond Plants, M.D., Ph.D. [place], [place]

## 2022-09-11 ENCOUNTER — Ambulatory Visit: Payer: PPO | Attending: Cardiovascular Disease

## 2022-09-11 DIAGNOSIS — R0602 Shortness of breath: Secondary | ICD-10-CM

## 2022-09-11 LAB — ECHOCARDIOGRAM COMPLETE
AR max vel: 2.23 cm2
AV Area VTI: 2.23 cm2
AV Area mean vel: 2.08 cm2
AV Mean grad: 3 mmHg
AV Peak grad: 6.4 mmHg
Ao pk vel: 1.26 m/s
Area-P 1/2: 2.85 cm2
Calc EF: 76.8 %
S' Lateral: 2.9 cm
Single Plane A2C EF: 77.6 %
Single Plane A4C EF: 78.9 %

## 2022-09-13 ENCOUNTER — Encounter: Payer: Self-pay | Admitting: *Deleted

## 2022-09-14 DIAGNOSIS — R001 Bradycardia, unspecified: Secondary | ICD-10-CM | POA: Diagnosis not present

## 2022-09-14 DIAGNOSIS — I4430 Unspecified atrioventricular block: Secondary | ICD-10-CM | POA: Diagnosis not present

## 2022-09-25 ENCOUNTER — Ambulatory Visit: Payer: PPO | Admitting: Cardiovascular Disease

## 2022-10-01 ENCOUNTER — Encounter: Payer: Self-pay | Admitting: Internal Medicine

## 2022-10-02 ENCOUNTER — Encounter: Payer: Self-pay | Admitting: Family

## 2022-10-02 ENCOUNTER — Telehealth (INDEPENDENT_AMBULATORY_CARE_PROVIDER_SITE_OTHER): Payer: PPO | Admitting: Family

## 2022-10-02 VITALS — Temp 100.0°F | Ht 60.0 in | Wt 108.0 lb

## 2022-10-02 DIAGNOSIS — U071 COVID-19: Secondary | ICD-10-CM | POA: Diagnosis not present

## 2022-10-02 MED ORDER — NIRMATRELVIR/RITONAVIR (PAXLOVID)TABLET: 3.0000 | ORAL_TABLET | Freq: Two times a day (BID) | ORAL | 0 refills | Status: AC

## 2022-10-02 NOTE — Progress Notes (Signed)
MyChart Video Visit    Virtual Visit via Video Note   This format is felt to be most appropriate for this patient at this time. Physical exam was limited by quality of the video and audio technology used for the visit. CMA was able to get the patient set up on a video visit.  Patient location: Home. Patient and provider in visit Provider location: Office  I discussed the limitations of evaluation and management by telemedicine and the availability of in person appointments. The patient expressed understanding and agreed to proceed.  Visit Date: 10/02/2022  Today's healthcare provider: Jeanie Sewer, NP     Subjective:   Patient ID: Nicole Nixon, female    DOB: [birth year], 76 y.o.   MRN: [id number]  Chief Complaint  Patient presents with   Covid Positive   HPI: URI sx:  pt tested positive yesterday. Symptoms include a dry cough, body aches, fever of 100.0 and headaches, which started Saturday night. Has tried tylenol which did help the fever. Denies sore throat, nausea or diarrhea.  Assessment & Plan:  1. COVID-19 - Sending Paxlovid, pt advised of FDA label approval for use, how to take, & SE. Advised of CDC guidelines for masking if out in public. OK to continue taking OTC sinus or pain meds. Encouraged to monitor & notify office of any worsening symptoms: increased shortness of breath, weakness, and signs of dehydration. Instructed to rest and hydrate well.   - nirmatrelvir/ritonavir EUA (PAXLOVID) 20 x 150 MG & 10 x '100MG'$  TABS; Take 3 tablets by mouth 2 (two) times daily for 5 days. (Take nirmatrelvir 150 mg two tablets twice daily for 5 days and ritonavir 100 mg one tablet twice daily for 5 days) Patient GFR is 65.  Dispense: 30 tablet; Refill: 0  Past Medical History:  Diagnosis Date   Allergy    Cancer ([place]) 2009   basal cell on forehead   Cataract    Glaucoma    Osteoporosis     Past Surgical History:  Procedure Laterality Date   APPENDECTOMY   1975   CATARACT EXTRACTION, BILATERAL  2016   [place]    Outpatient Medications Prior to Visit  Medication Sig Dispense Refill   fluticasone (FLONASE) 50 MCG/ACT nasal spray Place 2 sprays into both nostrils daily. 16 g 6   meloxicam (MOBIC) 7.5 MG tablet Take 1 tablet (7.5 mg total) by mouth daily. For hip pain 30 tablet 3   SHINGRIX injection      RHOPRESSA 0.02 % SOLN SMARTSIG:1 Drop(s) In Eye(s) Every Night     Facility-Administered Medications Prior to Visit  Medication Dose Route Frequency Provider Last Rate Last Admin   0.9 %  sodium chloride infusion  500 mL Intravenous Continuous Pyrtle, Lajuan Lines, MD        Allergies  Allergen Reactions   Codeine Other (See Comments)    Makes pt very agitated and anxious.    Erythromycin Other (See Comments)    Sever stomach cramping.     Penicillins Rash   Influenza Vac Split Quad     Allergic to eggs,  Needs exemption letter annually    Prednisone       Objective:   Physical Exam Vitals and nursing note reviewed.  Constitutional:      General: Pt is not in acute distress.    Appearance: Normal appearance.  HENT:  Head: Normocephalic.  Pulmonary:     Effort: No respiratory distress.  Musculoskeletal:     Cervical back: Normal range of motion.  Skin:    General: Skin is dry.     Coloration: Skin is not pale.  Neurological:     Mental Status: Pt is alert and oriented to person, place, and time.  Psychiatric:        Mood and Affect: Mood normal.   Temp 100 F (37.8 C) (Temporal)   Ht 5' (1.524 m)   Wt 108 lb (49 kg)   BMI 21.09 kg/m   Wt Readings from Last 3 Encounters:  10/02/22 108 lb (49 kg)  09/10/22 108 lb 2 oz (49 kg)  07/25/22 105 lb 9.6 oz (47.9 kg)      I discussed the assessment and treatment plan with the patient. The patient was provided an opportunity to ask questions and all were answered. The patient  agreed with the plan and demonstrated an understanding of the instructions.   The patient was advised to call back or seek an in-person evaluation if the symptoms worsen or if the condition fails to improve as anticipated.  Jeanie Sewer, NP [place] [phone] (phone) [phone] (fax)  [place]

## 2022-10-02 NOTE — Telephone Encounter (Signed)
Spoke with pt and scheduled her for a video visit this morning at [facility].

## 2022-10-03 ENCOUNTER — Ambulatory Visit: Payer: PPO | Admitting: Internal Medicine

## 2022-10-04 NOTE — Telephone Encounter (Signed)
Canceled appt 10/03/22.   Pt archived in [email or web address].  Please advise if patient and/or provider wish to proceed with [facility].

## 2022-10-08 ENCOUNTER — Telehealth: Payer: Self-pay | Admitting: *Deleted

## 2022-10-08 ENCOUNTER — Telehealth: Payer: Self-pay | Admitting: Cardiology

## 2022-10-08 DIAGNOSIS — R001 Bradycardia, unspecified: Secondary | ICD-10-CM | POA: Diagnosis not present

## 2022-10-08 DIAGNOSIS — I442 Atrioventricular block, complete: Secondary | ICD-10-CM

## 2022-10-08 DIAGNOSIS — I4430 Unspecified atrioventricular block: Secondary | ICD-10-CM | POA: Diagnosis not present

## 2022-10-08 NOTE — Telephone Encounter (Signed)
I was notified by [employer] that patient had critical EKG findings on the monitor that she wore from 10/13-10/27.   Monitor result is as follows: Patient had a min HR of 27 bpm, max HR of 81 bpm, and avg HR of 45 bpm. Predominant underlying rhythm was Sinus Rhythm. Possible Atrial Tachycardia with variable block was present. 6041 episode(s) of AV Block (2nd Mobitz II, High Grade, and 3rd)  occurred, lasting a total of 9 days 9 hours. AV Block (2nd Mobitz II and High Grade) was detected within +/- 45 seconds of symptomatic patient event(s). Isolated SVEs were rare (<1.0%), and no SVE Couplets or SVE Triplets were present. Isolated VEs  were rare (<1.0%), and no VE Couplets or VE Triplets were present. MD notification criteria for Complete Heart Block, Symptomatic High Grade AV Block, and Symptomatic Second Degree AV Block, Mobitz II met - report posted prior to notification per account  request (JM).  I contacted patient who informed me that she had talked to Kathryne Sharper RN earlier today about her monitor. Patient denies any syncope/near syncope and states that she has been "feeling good."   Per chart review, Kathryne Sharper RN contacted patient earlier today 11/6 and discussed monitor results. She also forwarded conversation to Dr. Rockey Situ, so he will be notified as well.   Margie Billet, PA-C 10/08/2022 7:35 PM

## 2022-10-08 NOTE — Telephone Encounter (Signed)
Called the patient to check on her. She stated that she feels sensations once in a while "like her heart is trying to catch up with her." She stated that it is the same as she discussed at her office visit. She denied chest pain and shortness of breath.   She was out walking her dog while on the phone and stated that she was asymptomatic. Message sent to the ordering provider.    End of summary monitor results received for the patient. Results are in epic.  Patient had a min HR of 27 bpm, max HR of 81 bpm, and avg HR of 45 bpm. Predominant underlying rhythm was Sinus Rhythm. Possible Atrial Tachycardia with variable block was present. 6041 episode(s) of AV Block (2nd Mobitz II, High Grade, and 3rd)  occurred, lasting a total of 9 days 9 hours. AV Block (2nd Mobitz II and High Grade) was detected within +/- 45 seconds of symptomatic patient event(s). Isolated SVEs were rare (<1.0%), and no SVE Couplets or SVE Triplets were present. Isolated VEs  were rare (<1.0%), and no VE Couplets or VE Triplets were present. MD notification criteria for Complete Heart Block, Symptomatic High Grade AV Block, and Symptomatic Second Degree AV Block, Mobitz II met - report posted prior to notification per account  request (JM).

## 2022-10-09 NOTE — Telephone Encounter (Signed)
Left message for pt to call back  °

## 2022-10-09 NOTE — Telephone Encounter (Signed)
-----   Message from Minna Merritts, MD sent at 10/09/2022  3:48 PM EST ----- Event monitor Data reviewed, significant AV block noted There is concern for high degree AV block, even complete heart block at times Would recommend we place referral to EP/electrophysiology for further evaluation

## 2022-10-09 NOTE — Telephone Encounter (Signed)
Spoke w/ pt and advised her of Dr. Donivan Scull recommendation. She is agreeable to referral to EP and will await a call from scheduling to get that set up. Referral place.

## 2022-10-09 NOTE — Telephone Encounter (Signed)
Patient is returning RN's call. Please advise. 

## 2022-10-15 ENCOUNTER — Encounter: Payer: Self-pay | Admitting: Cardiology

## 2022-10-15 ENCOUNTER — Encounter: Payer: Self-pay | Admitting: *Deleted

## 2022-10-15 ENCOUNTER — Ambulatory Visit: Payer: PPO | Attending: Cardiology | Admitting: Cardiology

## 2022-10-15 VITALS — BP 146/82 | HR 76 | Ht 60.0 in | Wt 106.6 lb

## 2022-10-15 DIAGNOSIS — Z01812 Encounter for preprocedural laboratory examination: Secondary | ICD-10-CM | POA: Diagnosis not present

## 2022-10-15 DIAGNOSIS — I441 Atrioventricular block, second degree: Secondary | ICD-10-CM

## 2022-10-15 NOTE — H&P (View-Only) (Signed)
Electrophysiology Office Note   Date:  10/15/2022   ID:  Nicole Nixon, DOB 02-25-1946, MRN [id number]  PCP:  Crecencio Mc, MD  Cardiologist:  Rockey Situ Primary Electrophysiologist:  Haidyn Kilburg Meredith Leeds, MD    Chief Complaint: Fatigue   History of Present Illness: Nicole Nixon is a 76 y.o. female who is being seen today for the evaluation of heart block at the request of Gollan, Kathlene November, MD. Presenting today for electrophysiology evaluation.  She was found by her primary physician to have heart block.  She has been having episodes of fatigue and weakness.  She is able to do most of her daily activities, but has noted more and more fatigue over the last few months.  She wore a cardiac monitor that confirmed second-degree heart block with symptoms of fatigue associated with bradycardia.  She states that her blood pressure was previously well controlled, but has been more elevated over the last few months.  Today, she denies symptoms of palpitations, chest pain, shortness of breath, orthopnea, PND, lower extremity edema, claudication, dizziness, presyncope, syncope, bleeding, or neurologic sequela. The patient is tolerating medications without difficulties.    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 16 g 6   meloxicam (MOBIC) 7.5 MG tablet Take 1 tablet (7.5 mg total) by mouth daily. For hip pain 30 tablet 3   SHINGRIX injection      Current Facility-Administered Medications  Medication Dose Route  Frequency Provider Last Rate Last Admin   0.9 %  sodium chloride infusion  500 mL Intravenous Continuous Pyrtle, Lajuan Lines, MD        Allergies:   Codeine, Erythromycin, Penicillins, Influenza vac split quad, and Prednisone   Social History:  The patient  reports that she quit smoking about 51 years ago. Her smoking use included cigarettes. She has never used smokeless tobacco. She reports that she does not drink alcohol and does not use drugs.   Family History:  The patient's family history is not on file. She was adopted.    ROS:  Please see the history of present illness.   Otherwise, review of systems is positive for none.   All other systems are reviewed and negative.    PHYSICAL EXAM: VS:  BP (!) 146/82   Pulse 76   Ht 5' (1.524 m)   Wt 106 lb 9.6 oz (48.4 kg)   SpO2 97%   BMI 20.82 kg/m  , BMI Body mass index is 20.82 kg/m. GEN: Well nourished, well developed, in no acute distress  HEENT: normal  Neck: no JVD, carotid bruits, or masses Cardiac: RRR; no murmurs, rubs, or gallops,no edema  Respiratory:  clear to auscultation bilaterally, normal work of breathing GI: soft, nontender, nondistended, + BS MS: no deformity or atrophy  Skin: warm and dry Neuro:  Strength and sensation are intact Psych: euthymic mood, full affect  EKG:  EKG is not ordered today. Personal review  of the ekg ordered 09/10/22 shows sinus rhythm, 2:1 AV block  Recent Labs: 07/25/2022: ALT 13; BUN 23; Creatinine, Ser 0.87; Hemoglobin 14.2; Platelets 200.0; Potassium 4.3; Sodium 140; TSH 1.83    Lipid Panel     Component Value Date/Time   CHOL 205 (H) 07/25/2022 0926   TRIG 89.0 07/25/2022 0926   HDL 66.20 07/25/2022 0926   CHOLHDL 3 07/25/2022 0926   VLDL 17.8 07/25/2022 0926   LDLCALC 121 (H) 07/25/2022 0926   LDLDIRECT 120.0 07/25/2022 0926     Wt Readings from Last 3 Encounters:  10/15/22 106 lb 9.6 oz (48.4 kg)  10/02/22 108 lb (49 kg)  09/10/22 108 lb 2 oz (49 kg)      Other  studies Reviewed: Additional studies/ records that were reviewed today include: TTE 09/11/22  Review of the above records today demonstrates:   1. Left ventricular ejection fraction, by estimation, is 60 to 65%. The  left ventricle has normal function. The left ventricle has no regional  wall motion abnormalities. Left ventricular diastolic parameters are  consistent with Grade I diastolic  dysfunction (impaired relaxation). The average left ventricular global  longitudinal strain is -24.5 %.   2. Right ventricular systolic function is normal. The right ventricular  size is normal.   3. The mitral valve is normal in structure. Mild to moderate mitral valve  regurgitation. No evidence of mitral stenosis.   4. The aortic valve is normal in structure. Aortic valve regurgitation is  not visualized. No aortic stenosis is present.   5. The inferior vena cava is normal in size with greater than 50%  respiratory variability, suggesting right atrial pressure of 3 mmHg.   Cardiac monitor 10/09/2022 personally reviewed Normal sinus rhythm with predominantly 2-1 AV block Patient had a min HR of 27 bpm, max HR of 81 bpm, and avg HR of 45 bpm.  Possible Atrial Tachycardia with variable block was present.  Chronotropic incompetence appreciated (bradycardia with walking) 6041 episode(s) of AV Block (2nd Mobitz II, High Grade, and 3rd) occurred, lasting a total of 9 days 9 hours.  Isolated SVEs were rare (<1.0%), and no SVE Couplets or SVE Triplets were present. Isolated VEs  were rare (<1.0%), and no VE Couplets or VE Triplets were present.   ASSESSMENT AND PLAN:  1.  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  There are no reversible causes.  He has not medication that would cause her heart block.  Due to that, we Dalya Maselli plan for pacemaker implant.  Risk and benefits of been discussed.  Risk include bleeding, tamponade, infection,  pneumothorax, lead dislodgment.  She understands these risks and has agreed to the procedure.  Case discussed with primary cardiology  Current medicines are reviewed at length with the patient today.   The patient does not have concerns regarding her medicines.  The following changes were made today:  none  Labs/ tests ordered today include:  Orders Placed This Encounter  Procedures   Basic metabolic panel   CBC     Disposition:   FU with Oluwaseyi Raffel 3 months  Signed, Richrd Kuzniar Meredith Leeds, MD  10/15/2022 4:50 PM     [facility] [address] [place] [address] [phone] (office) [phone] (fax)

## 2022-10-15 NOTE — Progress Notes (Signed)
Electrophysiology Office Note   Date:  10/15/2022   ID:  Nicole Nixon, DOB Jun 14, 1946, MRN [id number]  PCP:  Nicole Mc, MD  Cardiologist:  Nicole Nixon Primary Electrophysiologist:  Nicole Lacomb Meredith Leeds, MD    Chief Complaint: Fatigue   History of Present Illness: Nicole Nixon is a 76 y.o. female who is being seen today for the evaluation of heart block at the request of Nixon, Nicole November, MD. Presenting today for electrophysiology evaluation.  She was found by her primary physician to have heart block.  She has been having episodes of fatigue and weakness.  She is able to do most of her daily activities, but has noted more and more fatigue over the last few months.  She wore a cardiac monitor that confirmed second-degree heart block with symptoms of fatigue associated with bradycardia.  She states that her blood pressure was previously well controlled, but has been more elevated over the last few months.  Today, she denies symptoms of palpitations, chest pain, shortness of breath, orthopnea, PND, lower extremity edema, claudication, dizziness, presyncope, syncope, bleeding, or neurologic sequela. The patient is tolerating medications without difficulties.    Past Medical History:  Diagnosis Date   Allergy    Cancer ([place]) 2009   basal cell on forehead   Cataract    Glaucoma    Osteoporosis    Past Surgical History:  Procedure Laterality Date   APPENDECTOMY  1975   CATARACT EXTRACTION, BILATERAL  2016   [place]     Current Outpatient Medications  Medication Sig Dispense Refill   fluticasone (FLONASE) 50 MCG/ACT nasal spray Place 2 sprays into both nostrils daily. 16 g 6   meloxicam (MOBIC) 7.5 MG tablet Take 1 tablet (7.5 mg total) by mouth daily. For hip pain 30 tablet 3   SHINGRIX injection      Current Facility-Administered Medications  Medication Dose Route  Frequency Provider Last Rate Last Admin   0.9 %  sodium chloride infusion  500 mL Intravenous Continuous Pyrtle, Lajuan Lines, MD        Allergies:   Codeine, Erythromycin, Penicillins, Influenza vac split quad, and Prednisone   Social History:  The patient  reports that she quit smoking about 51 years ago. Her smoking use included cigarettes. She has never used smokeless tobacco. She reports that she does not drink alcohol and does not use drugs.   Family History:  The patient's family history is not on file. She was adopted.    ROS:  Please see the history of present illness.   Otherwise, review of systems is positive for none.   All other systems are reviewed and negative.    PHYSICAL EXAM: VS:  BP (!) 146/82   Pulse 76   Ht 5' (1.524 m)   Wt 106 lb 9.6 oz (48.4 kg)   SpO2 97%   BMI 20.82 kg/m  , BMI Body mass index is 20.82 kg/m. GEN: Well nourished, well developed, in no acute distress  HEENT: normal  Neck: no JVD, carotid bruits, or masses Cardiac: RRR; no murmurs, rubs, or gallops,no edema  Respiratory:  clear to auscultation bilaterally, normal work of breathing GI: soft, nontender, nondistended, + BS MS: no deformity or atrophy  Skin: warm and dry Neuro:  Strength and sensation are intact Psych: euthymic mood, full affect  EKG:  EKG is not ordered today. Personal review  of the ekg ordered 09/10/22 shows sinus rhythm, 2:1 AV block  Recent Labs: 07/25/2022: ALT 13; BUN 23; Creatinine, Ser 0.87; Hemoglobin 14.2; Platelets 200.0; Potassium 4.3; Sodium 140; TSH 1.83    Lipid Panel     Component Value Date/Time   CHOL 205 (H) 07/25/2022 0926   TRIG 89.0 07/25/2022 0926   HDL 66.20 07/25/2022 0926   CHOLHDL 3 07/25/2022 0926   VLDL 17.8 07/25/2022 0926   LDLCALC 121 (H) 07/25/2022 0926   LDLDIRECT 120.0 07/25/2022 0926     Wt Readings from Last 3 Encounters:  10/15/22 106 lb 9.6 oz (48.4 kg)  10/02/22 108 lb (49 kg)  09/10/22 108 lb 2 oz (49 kg)      Other  studies Reviewed: Additional studies/ records that were reviewed today include: TTE 09/11/22  Review of the above records today demonstrates:   1. Left ventricular ejection fraction, by estimation, is 60 to 65%. The  left ventricle has normal function. The left ventricle has no regional  wall motion abnormalities. Left ventricular diastolic parameters are  consistent with Grade I diastolic  dysfunction (impaired relaxation). The average left ventricular global  longitudinal strain is -24.5 %.   2. Right ventricular systolic function is normal. The right ventricular  size is normal.   3. The mitral valve is normal in structure. Mild to moderate mitral valve  regurgitation. No evidence of mitral stenosis.   4. The aortic valve is normal in structure. Aortic valve regurgitation is  not visualized. No aortic stenosis is present.   5. The inferior vena cava is normal in size with greater than 50%  respiratory variability, suggesting right atrial pressure of 3 mmHg.   Cardiac monitor 10/09/2022 personally reviewed Normal sinus rhythm with predominantly 2-1 AV block Patient had a min HR of 27 bpm, max HR of 81 bpm, and avg HR of 45 bpm.  Possible Atrial Tachycardia with variable block was present.  Chronotropic incompetence appreciated (bradycardia with walking) 6041 episode(s) of AV Block (2nd Mobitz II, High Grade, and 3rd) occurred, lasting a total of 9 days 9 hours.  Isolated SVEs were rare (<1.0%), and no SVE Couplets or SVE Triplets were present. Isolated VEs  were rare (<1.0%), and no VE Couplets or VE Triplets were present.   ASSESSMENT AND PLAN:  1.  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  There are no reversible causes.  He has not medication that would cause her heart block.  Due to that, we [name] plan for pacemaker implant.  Risk and benefits of been discussed.  Risk include bleeding, tamponade, infection,  pneumothorax, lead dislodgment.  She understands these risks and has agreed to the procedure.  Case discussed with primary cardiology  Current medicines are reviewed at length with the patient today.   The patient does not have concerns regarding her medicines.  The following changes were made today:  none  Labs/ tests ordered today include:  Orders Placed This Encounter  Procedures   Basic metabolic panel   CBC     Disposition:   FU with Olly Shiner 3 months  Signed, Emersyn Wyss Meredith Leeds, MD  10/15/2022 4:50 PM     [facility] [address] [place] [address] [phone] (office) [phone] (fax)

## 2022-10-15 NOTE — Patient Instructions (Addendum)
Medication Instructions:  Your physician recommends that you continue on your current medications as directed. Please refer to the Current Medication list given to you today.  *If you need a refill on your cardiac medications before your next appointment, please call your pharmacy*   Lab Work: Pre procedure labs today:  BMET & CBC If you have labs (blood work) drawn today and your tests are completely normal, you will receive your results only by: [place] (if you have MyChart) OR A paper copy in the mail If you have any lab test that is abnormal or we need to change your treatment, we will call you to review the results.   Testing/Procedures: Your physician has recommended that you have a pacemaker inserted. A pacemaker is a small device that is placed under the skin of your chest or abdomen to help control abnormal heart rhythms. This device uses electrical pulses to prompt the heart to beat at a normal rate. Pacemakers are used to treat heart rhythms that are too slow. Wire (leads) are attached to the pacemaker that goes into the chambers of you heart. This is done in the hospital and usually requires and overnight stay.  Please see the instruction sheet given to you today for more information.   Follow-Up: At [facility], you and your health needs are our priority.  As part of our continuing mission to provide you with exceptional heart care, we have created designated Provider Care Teams.  These Care Teams include your primary Cardiologist (physician) and Advanced Practice Providers (APPs -  Physician Assistants and Nurse Practitioners) who all work together to provide you with the care you need, when you need it.  Your next appointment:   2 week(s) after your pacemaker implant on 12/1  The format for your next appointment:   In Person  Provider:   Device clinic for a wound check     Thank you for choosing [facility]!!   Trinidad Curet, RN [phone]  Other  Instructions    Pacemaker Implantation, Adult Pacemaker implantation is a procedure to place a pacemaker inside your chest. A pacemaker is a small computer that sends electrical signals to the heart and helps your heart beat normally. A pacemaker also stores information about your heart rhythms. You may need pacemaker implantation if you: Have a slow heartbeat (bradycardia). Faint (syncope). Have shortness of breath (dyspnea) due to heart problems.  The pacemaker attaches to your heart through a wire, called a lead. Sometimes just one lead is needed. Other times, there will be two leads. There are two types of pacemakers: Transvenous pacemaker. This type is placed under the skin or muscle of your chest. The lead goes through a vein in the chest area to reach the inside of the heart. Epicardial pacemaker. This type is placed under the skin or muscle of your chest or belly. The lead goes through your chest to the outside of the heart.  Tell a health care provider about: Any allergies you have. All medicines you are taking, including vitamins, herbs, eye drops, creams, and over-the-counter medicines. Any problems you or family members have had with anesthetic medicines. Any blood or bone disorders you have. Any surgeries you have had. Any medical conditions you have. Whether you are pregnant or may be pregnant. What are the risks? Generally, this is a safe procedure. However, problems may occur, including: Infection. Bleeding. Failure of the pacemaker or the lead. Collapse of a lung or bleeding into a lung. Blood clot  inside a blood vessel with a lead. Damage to the heart. Infection inside the heart (endocarditis). Allergic reactions to medicines.  What happens before the procedure? Staying hydrated Follow instructions from your health care provider about hydration, which may include: Up to 2 hours before the procedure - you may continue to drink clear liquids, such as water, clear  fruit juice, black coffee, and plain tea.  Eating and drinking restrictions Follow instructions from your health care provider about eating and drinking, which may include: 8 hours before the procedure - stop eating heavy meals or foods such as meat, fried foods, or fatty foods. 6 hours before the procedure - stop eating light meals or foods, such as toast or cereal. 6 hours before the procedure - stop drinking milk or drinks that contain milk. 2 hours before the procedure - stop drinking clear liquids.  Medicines Ask your health care provider about: Changing or stopping your regular medicines. This is especially important if you are taking diabetes medicines or blood thinners. Taking medicines such as aspirin and ibuprofen. These medicines can thin your blood. Do not take these medicines before your procedure if your health care provider instructs you not to. You may be given antibiotic medicine to help prevent infection. General instructions You will have a heart evaluation. This may include an electrocardiogram (ECG), chest X-ray, and heart imaging (echocardiogram,  or echo) tests. You will have blood tests. Do not use any products that contain nicotine or tobacco, such as cigarettes and e-cigarettes. If you need help quitting, ask your health care provider. Plan to have someone take you home from the hospital or clinic. If you will be going home right after the procedure, plan to have someone with you for 24 hours. Ask your health care provider how your surgical site will be marked or identified. What happens during the procedure? To reduce your risk of infection: Your health care team will wash or sanitize their hands. Your skin will be washed with soap. Hair may be removed from the surgical area. An IV tube will be inserted into one of your veins. You will be given one or more of the following: A medicine to help you relax (sedative). A medicine to numb the area (local  anesthetic). A medicine to make you fall asleep (general anesthetic). If you are getting a transvenous pacemaker: An incision will be made in your upper chest. A pocket will be made for the pacemaker. It may be placed under the skin or between layers of muscle. The lead will be inserted into a blood vessel that returns to the heart. While X-rays are taken by an imaging machine (fluoroscopy), the lead will be advanced through the vein to the inside of your heart. The other end of the lead will be tunneled under the skin and attached to the pacemaker. If you are getting an epicardial pacemaker: An incision will be made near your ribs or breastbone (sternum) for the lead. The lead will be attached to the outside of your heart. Another incision will be made in your chest or upper belly to create a pocket for the pacemaker. The free end of the lead will be tunneled under the skin and attached to the pacemaker. The transvenous or epicardial pacemaker will be tested. Imaging studies may be done to check the lead position. The incisions will be closed with stitches (sutures), adhesive strips, or skin glue. Bandages (dressing) will be placed over the incisions. The procedure may vary among health care providers and  hospitals. What happens after the procedure? Your blood pressure, heart rate, breathing rate, and blood oxygen level will be monitored until the medicines you were given have worn off. You will be given antibiotics and pain medicine. ECG and chest x-rays will be done. You will wear a continuous type of ECG (Holter monitor) to check your heart rhythm. Your health care provider will program the pacemaker. Do not drive for 24 hours if you received a sedative. This information is not intended to replace advice given to you by your health care provider. Make sure you discuss any questions you have with your health care provider. Document Released: 11/09/2002 Document Revised: 06/08/2016  Document Reviewed: 05/02/2016 Elsevier Interactive Patient Education  2018 [employer].     Pacemaker Implantation, Adult, Care After This sheet gives you information about how to care for yourself after your procedure. Your health care provider may also give you more specific instructions. If you have problems or questions, contact your health care provider. What can I expect after the procedure? After the procedure, it is common to have: Mild pain. Slight bruising. Some swelling over the incision. A slight bump over the skin where the device was placed. Sometimes, it is possible to feel the device under the skin. This is normal.  Follow these instructions at home: Medicines Take over-the-counter and prescription medicines only as told by your health care provider. If you were prescribed an antibiotic medicine, take it as told by your health care provider. Do not stop taking the antibiotic even if you start to feel better. Wound care Do not remove the bandage on your chest until directed to do so by your health care provider. After your bandage is removed, you may see pieces of tape called skin adhesive strips over the area where the cut was made (incision site). Let them fall off on their own. Check the incision site every day to make sure it is not infected, bleeding, or starting to pull apart. Do not use lotions or ointments near the incision site unless directed to do so. Keep the incision area clean and dry for 2-3 days after the procedure or as directed by your health care provider. It takes several weeks for the incision site to completely heal. Do not take baths, swim, or use a hot tub for 7-10 days or as otherwise directed by your health care provider. Activity Do not drive or use heavy machinery while taking prescription pain medicine. Do not drive for 24 hours if you were given a medicine to help you relax (sedative). Check with your health care provider before you start to  drive or play sports. Avoid sudden jerking, pulling, or chopping movements that pull your upper arm far away from your body. Avoid these movements for at least 6 weeks or as long as told by your health care provider. Do not lift your upper arm above your shoulders for at least 6 weeks or as long as told by your health care provider. This means no tennis, golf, or swimming. You may go back to work when your health care provider says it is okay. Pacemaker care You may be shown how to transfer data from your pacemaker through the phone to your health care provider. Always let all health care providers know about your pacemaker before you have any medical procedures or tests. Wear a medical ID bracelet or necklace stating that you have a pacemaker. Carry a pacemaker ID card with you at all times. Your pacemaker battery will  last for 5-15 years. Routine checks by your health care provider will let the health care provider know when the battery is starting to run down. The pacemaker will need to be replaced when the battery starts to run down. Do not use amateur Chief of Staff. Other electrical devices are safe to use, including power tools, lawn mowers, and speakers. If you are unsure of whether something is safe to use, ask your health care provider. When using your cell phone, hold it to the ear opposite the pacemaker. Do not leave your cell phone in a pocket over the pacemaker. Avoid places or objects that have a strong electric or magnetic field, including: Engineer, maintenance. When at the airport, let officials know that you have a pacemaker. Power plants. Large electrical generators. Radiofrequency transmission towers, such as cell phone and radio towers. General instructions Weigh yourself every day. If you suddenly gain weight, fluid may be building up in your body. Keep all follow-up visits as told by your health care provider. This is important. Contact a  health care provider if: You gain weight suddenly. Your legs or feet swell. It feels like your heart is fluttering or skipping beats (heart palpitations). You have chills or a fever. You have more redness, swelling, or pain around your incisions. You have more fluid or blood coming from your incisions. Your incisions feel warm to the touch. You have pus or a bad smell coming from your incisions. Get help right away if: You have chest pain. You have trouble breathing or are short of breath. You become extremely tired. You are light-headed or you faint. This information is not intended to replace advice given to you by your health care provider. Make sure you discuss any questions you have with your health care provider. Document Released: 06/08/2005 Document Revised: 08/31/2016 Document Reviewed: 08/31/2016 Elsevier Interactive Patient Education  2018 [place] Discharge Instructions for  Pacemaker/Defibrillator Patients  ACTIVITY No heavy lifting or vigorous activity with your left/right arm for 6 to 8 weeks.  Do not raise your left/right arm above your head for one week.  Gradually raise your affected arm as drawn below.           __  NO DRIVING for     ; you may begin driving on     .  WOUND CARE Keep the wound area clean and dry.  Do not get this area wet for one week. No showers for one week; you may shower on     . The tape/steri-strips on your wound will fall off; do not pull them off.  No bandage is needed on the site.  DO  NOT apply any creams, oils, or ointments to the wound area. If you notice any drainage or discharge from the wound, any swelling or bruising at the site, or you develop a fever > 101? F after you are discharged home, call the office at once.  SPECIAL INSTRUCTIONS You are still able to use cellular telephones; use the ear opposite the side where you have your pacemaker/defibrillator.  Avoid carrying your cellular phone near your  device. When traveling through airports, show security personnel your identification card to avoid being screened in the metal detectors.  Ask the security personnel to use the hand wand. Avoid arc welding equipment, MRI testing (magnetic resonance imaging), TENS units (transcutaneous nerve stimulators).  Call the office for questions about other devices. Avoid electrical appliances that are in poor  condition or are not properly grounded. Microwave ovens are safe to be near or to operate.  ADDITIONAL INFORMATION FOR DEFIBRILLATOR PATIENTS SHOULD YOUR DEVICE GO OFF: If your device goes off ONCE and you feel fine afterward, notify the device clinic nurses. If your device goes off ONCE and you do not feel well afterward, call 911. If your device goes off TWICE, call 911. If your device goes off THREE TIMES IN ONE DAY, call 911.  DO NOT DRIVE YOURSELF OR A FAMILY MEMBER WITH A DEFIBRILLATOR TO THE HOSPITAL--CALL 911.

## 2022-10-16 LAB — BASIC METABOLIC PANEL
BUN/Creatinine Ratio: 27 (ref 12–28)
BUN: 22 mg/dL (ref 8–27)
CO2: 26 mmol/L (ref 20–29)
Calcium: 9.2 mg/dL (ref 8.7–10.3)
Chloride: 102 mmol/L (ref 96–106)
Creatinine, Ser: 0.83 mg/dL (ref 0.57–1.00)
Glucose: 96 mg/dL (ref 70–99)
Potassium: 4.2 mmol/L (ref 3.5–5.2)
Sodium: 141 mmol/L (ref 134–144)
eGFR: 74 mL/min/{1.73_m2} (ref >59)

## 2022-10-16 LAB — CBC
Hematocrit: 40.9 % (ref 34.0–46.6)
Hemoglobin: 13.8 g/dL (ref 11.1–15.9)
MCH: 30.8 pg (ref 26.6–33.0)
MCHC: 33.7 g/dL (ref 31.5–35.7)
MCV: 91 fL (ref 79–97)
Platelets: 274 10*3/uL (ref 150–450)
RBC: 4.48 x10E6/uL (ref 3.77–5.28)
RDW: 12.3 % (ref 11.7–15.4)
WBC: 7.7 10*3/uL (ref 3.4–10.8)

## 2022-10-18 ENCOUNTER — Ambulatory Visit
Admission: RE | Admit: 2022-10-18 | Discharge: 2022-10-18 | Disposition: A | Discharge status: Home / Self Care | Payer: PPO | Source: Ambulatory Visit | Attending: Internal Medicine | Admitting: Internal Medicine

## 2022-10-18 ENCOUNTER — Telehealth: Payer: Self-pay | Admitting: Cardiovascular Disease

## 2022-10-18 DIAGNOSIS — Z1231 Encounter for screening mammogram for malignant neoplasm of breast: Secondary | ICD-10-CM | POA: Diagnosis not present

## 2022-10-18 NOTE — Telephone Encounter (Signed)
I spoke with the patient. She saw Dr. Rockey Situ on 09/10/22 and was due to follow up with [place], PA on 10/29/22.  However, based on her monitor results- she has already seen Dr. Curt Bears with EP and is scheduled for a PPM placement on 11/02/22.  I have advised the patient that she does not need to keep her 10/29/22 follow up with general cardiology as this was to review her test results.  She is aware that once her device is placed she will need: 1) a wound check 10-14 days after with the Device nurse 2) 91 days with Dr. Curt Bears   However, I have also advised her I will forward a message to Dr. Rockey Situ to see at what point he would like her to see general cardiology and we will call her back.  The patient voices understanding and is agreeable.

## 2022-10-18 NOTE — Telephone Encounter (Signed)
Patient stated she is having surgery on 12/1 and wants to know if she will still need to keep her f/u appointment on 11/27.

## 2022-10-29 ENCOUNTER — Ambulatory Visit: Payer: PPO | Admitting: Medical

## 2022-10-31 DIAGNOSIS — H401131 Primary open-angle glaucoma, bilateral, mild stage: Secondary | ICD-10-CM | POA: Diagnosis not present

## 2022-11-01 NOTE — Pre-Procedure Instructions (Signed)
Instructed patient on the following items: Arrival time 1500 Nothing to eat or drink after midnight No meds AM of procedure Responsible person to drive you home and stay with you for 24 hrs Wash with special soap night before and morning of procedure

## 2022-11-02 ENCOUNTER — Other Ambulatory Visit: Payer: Self-pay

## 2022-11-02 ENCOUNTER — Ambulatory Visit (HOSPITAL_COMMUNITY)
Admission: RE | Admit: 2022-11-02 | Discharge: 2022-11-02 | Disposition: A | Discharge status: Home / Self Care | Payer: PPO | Attending: Cardiology | Admitting: Cardiology

## 2022-11-02 ENCOUNTER — Encounter (HOSPITAL_COMMUNITY)
Admission: RE | Disposition: A | Discharge status: Home / Self Care | Payer: Self-pay | Source: Home / Self Care | Attending: Cardiology

## 2022-11-02 ENCOUNTER — Ambulatory Visit (HOSPITAL_COMMUNITY): Payer: PPO

## 2022-11-02 DIAGNOSIS — R001 Bradycardia, unspecified: Secondary | ICD-10-CM | POA: Insufficient documentation

## 2022-11-02 DIAGNOSIS — I441 Atrioventricular block, second degree: Secondary | ICD-10-CM | POA: Insufficient documentation

## 2022-11-02 DIAGNOSIS — Z950 Presence of cardiac pacemaker: Secondary | ICD-10-CM | POA: Diagnosis not present

## 2022-11-02 HISTORY — PX: PACEMAKER IMPLANT: EP1218

## 2022-11-02 SURGERY — PACEMAKER IMPLANT

## 2022-11-02 MED ORDER — ONDANSETRON HCL 4 MG/2ML IJ SOLN: 4.0000 mg | Freq: Four times a day (QID) | INTRAMUSCULAR | Status: DC | PRN

## 2022-11-02 MED ORDER — SODIUM CHLORIDE 0.9 % IV SOLN
INTRAVENOUS | Status: AC
  Filled 2022-11-02: qty 2

## 2022-11-02 MED ORDER — MIDAZOLAM HCL 5 MG/5ML IJ SOLN
INTRAMUSCULAR | Status: AC
  Filled 2022-11-02: qty 5

## 2022-11-02 MED ORDER — HEPARIN (PORCINE) IN NACL 1000-0.9 UT/500ML-% IV SOLN
INTRAVENOUS | Status: DC | PRN
  Administered 2022-11-02: 500 mL

## 2022-11-02 MED ORDER — LIDOCAINE HCL (PF) 1 % IJ SOLN
INTRAMUSCULAR | Status: DC | PRN
  Administered 2022-11-02: 60 mL

## 2022-11-02 MED ORDER — SODIUM CHLORIDE 0.9 % IV SOLN: INTRAVENOUS | Status: DC

## 2022-11-02 MED ORDER — SODIUM CHLORIDE 0.9 % IV SOLN
80.0000 mg | INTRAVENOUS | Status: AC
  Administered 2022-11-02: 80 mg

## 2022-11-02 MED ORDER — ACETAMINOPHEN 325 MG PO TABS
325.0000 mg | ORAL_TABLET | ORAL | Status: DC | PRN
  Administered 2022-11-02: 650 mg via ORAL
  Filled 2022-11-02: qty 2

## 2022-11-02 MED ORDER — MIDAZOLAM HCL 5 MG/5ML IJ SOLN
INTRAMUSCULAR | Status: DC | PRN
  Administered 2022-11-02: 1 mg via INTRAVENOUS

## 2022-11-02 MED ORDER — CHLORHEXIDINE GLUCONATE 4 % EX LIQD: 4.0000 | Freq: Once | CUTANEOUS | Status: DC

## 2022-11-02 MED ORDER — LIDOCAINE HCL (PF) 1 % IJ SOLN
INTRAMUSCULAR | Status: AC
  Filled 2022-11-02: qty 30

## 2022-11-02 MED ORDER — VANCOMYCIN HCL IN DEXTROSE 1-5 GM/200ML-% IV SOLN
1000.0000 mg | INTRAVENOUS | Status: AC
  Administered 2022-11-02: 1000 mg via INTRAVENOUS

## 2022-11-02 MED ORDER — FENTANYL CITRATE (PF) 100 MCG/2ML IJ SOLN
INTRAMUSCULAR | Status: DC | PRN
  Administered 2022-11-02: 12.5 ug via INTRAVENOUS

## 2022-11-02 MED ORDER — VANCOMYCIN HCL IN DEXTROSE 1-5 GM/200ML-% IV SOLN
INTRAVENOUS | Status: AC
  Filled 2022-11-02: qty 200

## 2022-11-02 MED ORDER — FENTANYL CITRATE (PF) 100 MCG/2ML IJ SOLN
INTRAMUSCULAR | Status: AC
  Filled 2022-11-02: qty 2

## 2022-11-02 SURGICAL SUPPLY — 13 items
CABLE SURGICAL S-101-97-12 (CABLE) ×1 IMPLANT
CATH RIGHTSITE C315HIS02 (CATHETERS) IMPLANT
IPG PACE AZUR XT DR MRI W1DR01 (Pacemaker) IMPLANT
LEAD CAPSURE NOVUS 5076-52CM (Lead) IMPLANT
LEAD SELECT SECURE 3830 383069 (Lead) IMPLANT
PACE AZURE XT DR MRI W1DR01 (Pacemaker) ×1 IMPLANT
PAD DEFIB RADIO PHYSIO CONN (PAD) ×1 IMPLANT
SELECT SECURE 3830 383069 (Lead) ×1 IMPLANT
SHEATH 7FR PRELUDE SNAP 13 (SHEATH) IMPLANT
SHEATH PROBE COVER 6X72 (BAG) IMPLANT
SLITTER UNIVERSAL DS2A003 (MISCELLANEOUS) IMPLANT
TRAY PACEMAKER INSERTION (PACKS) ×1 IMPLANT
WIRE EMERALD 3MM-J .035X150CM (WIRE) IMPLANT

## 2022-11-02 NOTE — Interval H&P Note (Signed)
History and Physical Interval Note:  11/02/2022 1:26 PM  Nicole Nixon  has presented today for surgery, with the diagnosis of bradicardia.  The various methods of treatment have been discussed with the patient and family. After consideration of risks, benefits and other options for treatment, the patient has consented to  Procedure(s): PACEMAKER IMPLANT (N/A) as a surgical intervention.  The patient's history has been reviewed, patient examined, no change in status, stable for surgery.  I have reviewed the patient's chart and labs.  Questions were answered to the patient's satisfaction.     Jeffry Vogelsang [employer]

## 2022-11-02 NOTE — Discharge Instructions (Signed)
After Your Pacemaker   You have a Medtronic Pacemaker  ACTIVITY Do not lift your arm above shoulder height for 1 week after your procedure. After 7 days, you may progress as below.  You should remove your sling 24 hours after your procedure, unless otherwise instructed by your provider.     Friday November 09, 2022  Saturday November 10, 2022 Sunday November 11, 2022 Monday November 12, 2022   Do not lift, push, pull, or carry anything over 10 pounds with the affected arm until 6 weeks (Friday December 14, 2022 ) after your procedure.   You may drive AFTER your wound check, unless you have been told otherwise by your provider.   Ask your healthcare provider when you can go back to work   INCISION/Dressing  If large square, outer bandage is left in place, this can be removed after 24 hours from your procedure. Do not remove steri-strips or glue as below.   Monitor your Pacemaker site for redness, swelling, and drainage. Call the device clinic at [phone] if you experience these symptoms or fever/chills.  If your incision is sealed with Steri-strips or staples, you may shower 7 days after your procedure or when told by your provider. Do not remove the steri-strips or let the shower hit directly on your site. You may wash around your site with soap and water.    If you were discharged in a sling, please do not wear this during the day more than 48 hours after your surgery unless otherwise instructed. This may increase the risk of stiffness and soreness in your shoulder.   Avoid lotions, ointments, or perfumes over your incision until it is well-healed.  You may use a hot tub or a pool AFTER your wound check appointment if the incision is completely closed.  Pacemaker Alerts:  Some alerts are vibratory and others beep. These are NOT emergencies. Please call our office to let us know. If this occurs at night or on weekends, it can wait until the next business day. Send a remote  transmission.  If your device is capable of reading fluid status (for heart failure), you will be offered monthly monitoring to review this with you.   DEVICE MANAGEMENT Remote monitoring is used to monitor your pacemaker from home. This monitoring is scheduled every 91 days by our office. It allows [place] to keep an eye on the functioning of your device to ensure it is working properly. You will routinely see your Electrophysiologist annually (more often if necessary).   You should receive your ID card for your new device in 4-8 weeks. Keep this card with you at all times once received. Consider wearing a medical alert bracelet or necklace.  Your Pacemaker may be MRI compatible. This will be discussed at your next office visit/wound check.  You should avoid contact with strong electric or magnetic fields.   Do not use amateur (ham) radio equipment or electric (arc) welding torches. MP3 player headphones with magnets should not be used. Some devices are safe to use if held at least 12 inches (30 cm) from your Pacemaker. These include power tools, lawn mowers, and speakers. If you are unsure if something is safe to use, ask your health care provider.  When using your cell phone, hold it to the ear that is on the opposite side from the Pacemaker. Do not leave your cell phone in a pocket over the Pacemaker.  You may safely use electric blankets, heating pads, computers, and microwave ovens.  Call the office right away if: You have chest pain. You feel more short of breath than you have felt before. You feel more light-headed than you have felt before. Your incision starts to open up.  This information is not intended to replace advice given to you by your health care provider. Make sure you discuss any questions you have with your health care provider.

## 2022-11-05 ENCOUNTER — Encounter (HOSPITAL_COMMUNITY): Payer: Self-pay | Admitting: Cardiology

## 2022-11-05 ENCOUNTER — Telehealth: Payer: Self-pay

## 2022-11-05 NOTE — Telephone Encounter (Signed)
Follow-up after same day discharge: Implant date: 11/02/2022 MD: Allegra Lai, MD Device: Medtronic [phone] Azure XT DR MRI Location: Left Chest   Wound check visit: 11/14/2022 @ 10:00 AM 90 day MD follow-up: 02/06/2023 @ 3:30 PM  Remote Transmission received:Yes  Dressing/sling removed: Yes  Confirm OAC restart on: N/A

## 2022-11-14 ENCOUNTER — Ambulatory Visit: Payer: PPO

## 2022-11-14 ENCOUNTER — Ambulatory Visit: Payer: PPO | Attending: Cardiovascular Disease

## 2022-11-14 ENCOUNTER — Telehealth: Payer: Self-pay | Admitting: Cardiovascular Disease

## 2022-11-14 DIAGNOSIS — I441 Atrioventricular block, second degree: Secondary | ICD-10-CM | POA: Diagnosis not present

## 2022-11-14 LAB — CUP PACEART INCLINIC DEVICE CHECK
Battery Remaining Longevity: 142 mo
Battery Voltage: 3.21 V
Brady Statistic AP VP Percent: 3.36 %
Brady Statistic AP VS Percent: 0.23 %
Brady Statistic AS VP Percent: 96.38 %
Brady Statistic AS VS Percent: 0.03 %
Brady Statistic RA Percent Paced: 3.57 %
Brady Statistic RV Percent Paced: 99.75 %
Date Time Interrogation Session: 20231213105415
Implantable Lead Connection Status: 753985
Implantable Lead Connection Status: 753985
Implantable Lead Implant Date: 20231201
Implantable Lead Implant Date: 20231201
Implantable Lead Location: 753859
Implantable Lead Location: 753860
Implantable Lead Model: 3830
Implantable Lead Model: 5076
Implantable Pulse Generator Implant Date: 20231201
Lead Channel Impedance Value: 323 Ohm
Lead Channel Impedance Value: 361 Ohm
Lead Channel Impedance Value: 456 Ohm
Lead Channel Impedance Value: 589 Ohm
Lead Channel Pacing Threshold Amplitude: 0.625 V
Lead Channel Pacing Threshold Amplitude: 1 V
Lead Channel Pacing Threshold Pulse Width: 0.4 ms
Lead Channel Pacing Threshold Pulse Width: 0.4 ms
Lead Channel Sensing Intrinsic Amplitude: 2 mV
Lead Channel Sensing Intrinsic Amplitude: 3.25 mV
Lead Channel Sensing Intrinsic Amplitude: 6 mV
Lead Channel Sensing Intrinsic Amplitude: 9.25 mV
Lead Channel Setting Pacing Amplitude: 3.5 V
Lead Channel Setting Pacing Amplitude: 3.5 V
Lead Channel Setting Pacing Pulse Width: 0.4 ms
Lead Channel Setting Sensing Sensitivity: 1.2 mV
Zone Setting Status: 755011
Zone Setting Status: 755011

## 2022-11-14 NOTE — Telephone Encounter (Signed)
LVM to schedule, please schedule.

## 2022-11-14 NOTE — Progress Notes (Signed)

## 2022-11-14 NOTE — Patient Instructions (Signed)

## 2022-12-11 DIAGNOSIS — H401131 Primary open-angle glaucoma, bilateral, mild stage: Secondary | ICD-10-CM | POA: Diagnosis not present

## 2022-12-24 ENCOUNTER — Encounter: Payer: Self-pay | Admitting: Cardiovascular Disease

## 2022-12-24 ENCOUNTER — Telehealth: Payer: Self-pay | Admitting: Cardiovascular Disease

## 2022-12-24 NOTE — Telephone Encounter (Signed)
   Pre-operative Risk Assessment   Error

## 2022-12-24 NOTE — Telephone Encounter (Signed)
   Patient Name: Nicole Nixon  DOB: 17-Sep-1946 MRN: [id number]  Primary Cardiologist: None  Chart reviewed as part of pre-operative protocol coverage.   Simple dental extractions (i.e. 1-2 teeth) and cleanings are considered low risk procedures per guidelines and generally do not require any specific cardiac clearance. It is also generally accepted that for simple extractions and dental cleanings, there is no need to interrupt blood thinner therapy.   SBE prophylaxis is not required for the patient from a cardiac standpoint.  I will route this recommendation to the requesting party via Epic fax function and remove from pre-op pool.  Please call with questions.  Lenna Sciara, NP 12/24/2022, 9:58 AM

## 2022-12-24 NOTE — Telephone Encounter (Signed)
   Pre-operative Risk Assessment    Patient Name: Nicole Nixon  DOB: Apr 11, 1946 MRN: [id number]      Request for Surgical Clearance    Procedure:   Dental Cleaning  Date of Surgery:  Clearance 12/24/22                                 Surgeon:  Dr. Norman Herrlich Surgeon's Group or Practice Name:  [facility] Phone number:  [phone] Fax number:  [phone]   Type of Clearance Requested:   - Medical    Type of Anesthesia:  None    Additional requests/questions:  Does this patient need antibiotics?  Signed, Belisicia T Harris   12/24/2022, 8:11 AM

## 2022-12-24 NOTE — Telephone Encounter (Signed)
Error

## 2022-12-31 NOTE — Progress Notes (Unsigned)
Cardiology Office Note  Date:  01/01/2023   ID:  Nicole Nixon, Nicole Nixon [birth year], MRN [id number]  PCP:  Crecencio Mc, MD   Chief Complaint  Patient presents with   Follow-up    Patient had a pacemaker implant on 11/02/2022. Medications reviewed by the patient verbally.     HPI:  Nicole Nixon is a 77 year old woman with past medical history of Cancer Remote smoking, age Osteoporosis Chronic insomnia Hyperlipidemia AV block, status post pacer Medtronic [id number] Azure XT DR MRI  Who presents for follow-up of her bradycardia, Mobitz type II second-degree AV block, status post pacemaker  Last seen by myself in clinic October 2023  Seen by Dr. Derrel Nip July 25, 2022 EKG obtained documenting sinus bradycardia with AV block  Seen in our office, Zio monitor ordered Zio monitor with concern for high degree AV block November 2023 Was referred to EP Pacemaker placed for symptoms of fatigue, weakness, AV block  In follow-up reports that she feels well, no complaints  CT abdomen pulled up from 2015 showing mild scattered aortic atherosclerosis Total cholesterol around 200  EKG personally reviewed by myself on todays visit Sinus rhythm atrial sensed V paced rate 79 bpm  PMH:   has a past medical history of Allergy, Cancer ([place]) (2009), Cataract, Glaucoma, and Osteoporosis.  PSH:    Past Surgical History:  Procedure Laterality Date   APPENDECTOMY  1975   CATARACT EXTRACTION, BILATERAL  2016   CESAREAN SECTION  1978   DILATION AND CURETTAGE OF UTERUS  1975   PACEMAKER IMPLANT N/A 11/02/2022   Procedure: PACEMAKER IMPLANT;  Surgeon: Constance Haw, MD;  Location: [place] [facility];  Service: Cardiovascular;  Laterality: N/A;   TONSILLECTOMY AND ADENOIDECTOMY  1958    Current Outpatient Medications  Medication Sig Dispense Refill   cholecalciferol (VITAMIN D3) 25 MCG (1000 UNIT) tablet Take 1,000 Units by mouth daily.     Multiple Vitamins-Minerals (PRESERVISION  AREDS 2) CAPS Take 1 tablet by mouth daily.     SHINGRIX injection      timolol (TIMOPTIC) 0.5 % ophthalmic solution Place 1 drop into both eyes every morning.     zinc gluconate 50 MG tablet Take 50 mg by mouth daily.     Current Facility-Administered Medications  Medication Dose Route Frequency Provider Last Rate Last Admin   0.9 %  sodium chloride infusion  500 mL Intravenous Continuous Pyrtle, Lajuan Lines, MD        Allergies:   Codeine, Erythromycin, Penicillins, Influenza vac split quad, and Prednisone   Social History:  The patient  reports that she quit smoking about 52 years ago. Her smoking use included cigarettes. She has never used smokeless tobacco. She reports that she does not drink alcohol and does not use drugs.   Family History:   family history is not on file. She was adopted.    Review of Systems: Review of Systems  Constitutional: Negative.   HENT: Negative.    Respiratory: Negative.    Cardiovascular: Negative.   Gastrointestinal: Negative.   Musculoskeletal: Negative.   Neurological: Negative.   Psychiatric/Behavioral: Negative.    All other systems reviewed and are negative.   PHYSICAL EXAM: VS:  BP 110/76 (BP Location: Left Arm, Patient Position: Sitting, Cuff Size: Normal)   Pulse 79   Ht 5' (1.524 m)   Wt 108 lb 4 oz (49.1 kg)   SpO2 98%   BMI 21.14 kg/m  , BMI Body mass index is 21.14 kg/m.  Constitutional:  oriented to person, place, and time. No distress.  HENT:  Head: Grossly normal Eyes:  no discharge. No scleral icterus.  Neck: No JVD, no carotid bruits  Cardiovascular: Regular rate and rhythm, no murmurs appreciated Pulmonary/Chest: Clear to auscultation bilaterally, no wheezes or rails Abdominal: Soft.  no distension.  no tenderness.  Musculoskeletal: Normal range of motion Neurological:  normal muscle tone. Coordination normal. No atrophy Skin: Skin warm and dry Psychiatric: normal affect, pleasant  Recent Labs: 07/25/2022: ALT 13; TSH  1.83 10/15/2022: BUN 22; Creatinine, Ser 0.83; Hemoglobin 13.8; Platelets 274; Potassium 4.2; Sodium 141    Lipid Panel Lab Results  Component Value Date   CHOL 205 (H) 07/25/2022   HDL 66.20 07/25/2022   LDLCALC 121 (H) 07/25/2022   TRIG 89.0 07/25/2022      Wt Readings from Last 3 Encounters:  01/01/23 108 lb 4 oz (49.1 kg)  11/02/22 108 lb (49 kg)  10/15/22 106 lb 9.6 oz (48.4 kg)      ASSESSMENT AND PLAN:  Problem List Items Addressed This Visit       Cardiology Problems   Hyperlipidemia LDL goal <130     Other   Bradycardia with 41-50 beats per minute   Other Visit Diagnoses     Heart block AV complete (HCC)    -  Primary   Second degree AV block       Shortness of breath       PAC (premature atrial contraction)         Symptomatic bradycardia/AV block Zio monitor with concern for high degree AV block November 2023 Was referred to EP, Pacemaker placed for symptoms of fatigue, weakness, AV block Feels well on today's visit No medication changes made  Aortic atherosclerosis Mild on CT scan 2015, images pulled up and reviewed Lipid management with medication offered, she would like to think about it and discussed with Dr. Derrel Nip Aortic atherosclerosis is very mild, punctate spots diffusely Current non-smoker, no diabetes Recommend she call us if she would like to start low-dose statin   Total encounter time more than 30 minutes  Greater than 50% was spent in counseling and coordination of care with the patient    Signed, Esmond Plants, M.D., Ph.D. [place], [place]

## 2023-01-01 ENCOUNTER — Ambulatory Visit: Payer: PPO | Attending: Cardiovascular Disease | Admitting: Cardiovascular Disease

## 2023-01-01 ENCOUNTER — Encounter: Payer: Self-pay | Admitting: Cardiovascular Disease

## 2023-01-01 VITALS — BP 110/76 | HR 79 | Ht 60.0 in | Wt 108.2 lb

## 2023-01-01 DIAGNOSIS — I441 Atrioventricular block, second degree: Secondary | ICD-10-CM | POA: Diagnosis not present

## 2023-01-01 DIAGNOSIS — I442 Atrioventricular block, complete: Secondary | ICD-10-CM | POA: Diagnosis not present

## 2023-01-01 DIAGNOSIS — I491 Atrial premature depolarization: Secondary | ICD-10-CM | POA: Diagnosis not present

## 2023-01-01 DIAGNOSIS — E785 Hyperlipidemia, unspecified: Secondary | ICD-10-CM | POA: Diagnosis not present

## 2023-01-01 DIAGNOSIS — R0602 Shortness of breath: Secondary | ICD-10-CM | POA: Diagnosis not present

## 2023-01-01 DIAGNOSIS — R001 Bradycardia, unspecified: Secondary | ICD-10-CM

## 2023-01-01 NOTE — Patient Instructions (Signed)
Medication Instructions:  No changes  If you need a refill on your cardiac medications before your next appointment, please call your pharmacy.   Lab work: No new labs needed  Testing/Procedures: No new testing needed  Follow-Up: At [facility], you and your health needs are our priority.  As part of our continuing mission to provide you with exceptional heart care, we have created designated Provider Care Teams.  These Care Teams include your primary Cardiologist (physician) and Advanced Practice Providers (APPs -  Physician Assistants and Nurse Practitioners) who all work together to provide you with the care you need, when you need it.  You will need a follow up appointment in 12 months  Providers on your designated Care Team:   Christopher Berge, NP Ryan Dunn, PA-C Cadence Furth, PA-C  COVID-19 Vaccine Information can be found at: [email or web address] For questions related to vaccine distribution or appointments, please email [email or web address] or call [phone].   

## 2023-02-04 ENCOUNTER — Ambulatory Visit (INDEPENDENT_AMBULATORY_CARE_PROVIDER_SITE_OTHER): Payer: PPO

## 2023-02-04 DIAGNOSIS — I442 Atrioventricular block, complete: Secondary | ICD-10-CM | POA: Diagnosis not present

## 2023-02-05 LAB — CUP PACEART REMOTE DEVICE CHECK
Battery Remaining Longevity: 142 mo
Battery Voltage: 3.17 V
Brady Statistic AP VP Percent: 5.77 %
Brady Statistic AP VS Percent: 0.94 %
Brady Statistic AS VP Percent: 84.57 %
Brady Statistic AS VS Percent: 8.72 %
Brady Statistic RA Percent Paced: 6.7 %
Brady Statistic RV Percent Paced: 90.34 %
Date Time Interrogation Session: 20240303231347
Implantable Lead Connection Status: 753985
Implantable Lead Connection Status: 753985
Implantable Lead Implant Date: 20231201
Implantable Lead Implant Date: 20231201
Implantable Lead Location: 753859
Implantable Lead Location: 753860
Implantable Lead Model: 3830
Implantable Lead Model: 5076
Implantable Pulse Generator Implant Date: 20231201
Lead Channel Impedance Value: 342 Ohm
Lead Channel Impedance Value: 399 Ohm
Lead Channel Impedance Value: 475 Ohm
Lead Channel Impedance Value: 646 Ohm
Lead Channel Pacing Threshold Amplitude: 0.5 V
Lead Channel Pacing Threshold Amplitude: 1 V
Lead Channel Pacing Threshold Pulse Width: 0.4 ms
Lead Channel Pacing Threshold Pulse Width: 0.4 ms
Lead Channel Sensing Intrinsic Amplitude: 2.25 mV
Lead Channel Sensing Intrinsic Amplitude: 2.25 mV
Lead Channel Sensing Intrinsic Amplitude: 8.875 mV
Lead Channel Sensing Intrinsic Amplitude: 8.875 mV
Lead Channel Setting Pacing Amplitude: 3.5 V
Lead Channel Setting Pacing Amplitude: 3.5 V
Lead Channel Setting Pacing Pulse Width: 0.4 ms
Lead Channel Setting Sensing Sensitivity: 1.2 mV
Zone Setting Status: 755011
Zone Setting Status: 755011

## 2023-02-06 ENCOUNTER — Ambulatory Visit: Payer: PPO | Attending: Cardiology | Admitting: Cardiology

## 2023-02-06 ENCOUNTER — Encounter: Payer: Self-pay | Admitting: Cardiology

## 2023-02-06 VITALS — BP 120/78 | HR 65 | Ht 60.0 in | Wt 109.0 lb

## 2023-02-06 DIAGNOSIS — I441 Atrioventricular block, second degree: Secondary | ICD-10-CM | POA: Diagnosis not present

## 2023-02-06 NOTE — Progress Notes (Signed)
Electrophysiology Office Note   Date:  02/06/2023   ID:  Nicole Nixon, DOB August 21, 1946, MRN [id number]  PCP:  Crecencio Mc, MD  Cardiologist:  Rockey Situ Primary Electrophysiologist:  Poppi Scantling Meredith Leeds, MD    Chief Complaint: Fatigue   History of Present Illness: Nicole Nixon is a 77 y.o. female who is being seen today for the evaluation of heart block at the request of Crecencio Mc, MD. Presenting today for electrophysiology evaluation.  She was found by her primary physician to have heart block.  She was having episodes of fatigue and weakness.  She was able to do most of her daily activities but has had more fatigue over the last few months.  She wore a cardiac monitor that confirmed second-degree AV block with associated symptoms of fatigue.  She is now status post Medtronic dual-chamber pacemaker implanted 11/02/2022.  Today, denies symptoms of palpitations, chest pain, shortness of breath, orthopnea, PND, lower extremity edema, claudication, dizziness, presyncope, syncope, bleeding, or neurologic sequela. The patient is tolerating medications without difficulties.  Since her pacemaker she is has felt well.  She has had no further episodes of weakness or fatigue.  She is able to do exert herself without issue.    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     Multiple Vitamins-Minerals (PRESERVISION AREDS 2) CAPS Take 1 tablet by mouth daily.     SHINGRIX injection      timolol (TIMOPTIC) 0.5 % ophthalmic solution Place 1 drop into both eyes every morning.     zinc gluconate 50 MG tablet Take 50 mg by mouth daily.     Current Facility-Administered Medications  Medication Dose Route Frequency Provider Last Rate Last Admin   0.9 %  sodium chloride infusion  500 mL Intravenous Continuous Pyrtle, Lajuan Lines, MD        Allergies:   Codeine, Erythromycin, Penicillins, Influenza vac split quad, and Prednisone   Social History:  The patient  reports that she quit smoking about 52 years ago. Her smoking use included cigarettes. She has never used smokeless tobacco. She reports that she does not drink alcohol and does not use drugs.   Family History:  The patient's family history is not on file. She was adopted.   ROS:  Please see the history of present illness.   Otherwise, review of systems is positive for none.   All other systems are reviewed and negative.   PHYSICAL EXAM: VS:  BP 120/78   Pulse 65   Ht 5' (1.524 m)   Wt 109 lb (49.4 kg)   SpO2 98%   BMI 21.29 kg/m  , BMI Body mass index is 21.29 kg/m. GEN: Well nourished, well developed, in no acute distress  HEENT: normal  Neck: no JVD, carotid bruits, or masses Cardiac: RRR; no murmurs, rubs, or gallops,no edema  Respiratory:  clear to auscultation bilaterally, normal work of breathing GI: soft, nontender, nondistended, + BS MS: no deformity or atrophy  Skin: warm and dry, device site well healed Neuro:  Strength and sensation are intact Psych: euthymic mood, full affect  EKG:  EKG is ordered today. Personal review of the ekg ordered shows sinus rhythm,  paced  Personal review of the device interrogation today. Results in [place]: 07/25/2022: ALT 13; TSH 1.83 10/15/2022: BUN 22; Creatinine, Ser 0.83; Hemoglobin 13.8; Platelets  274; Potassium 4.2; Sodium 141    Lipid Panel     Component Value Date/Time   CHOL 205 (H) 07/25/2022 0926   TRIG 89.0 07/25/2022 0926   HDL 66.20 07/25/2022 0926   CHOLHDL 3 07/25/2022 0926   VLDL 17.8 07/25/2022 0926   LDLCALC 121 (H) 07/25/2022 0926   LDLDIRECT 120.0 07/25/2022 0926     Wt Readings from Last 3 Encounters:  02/06/23 109 lb (49.4 kg)  01/01/23 108 lb 4 oz (49.1 kg)  11/02/22 108 lb (49 kg)      Other studies Reviewed: Additional studies/ records that were reviewed today include: TTE 09/11/22  Review of the above records today demonstrates:   1. Left ventricular ejection fraction, by estimation, is 60 to 65%. The  left ventricle has normal function. The left ventricle has no regional  wall motion abnormalities. Left ventricular diastolic parameters are  consistent with Grade I diastolic  dysfunction (impaired relaxation). The average left ventricular global  longitudinal strain is -24.5 %.   2. Right ventricular systolic function is normal. The right ventricular  size is normal.   3. The mitral valve is normal in structure. Mild to moderate mitral valve  regurgitation. No evidence of mitral stenosis.   4. The aortic valve is normal in structure. Aortic valve regurgitation is  not visualized. No aortic stenosis is present.   5. The inferior vena cava is normal in size with greater than 50%  respiratory variability, suggesting right atrial pressure of 3 mmHg.   Cardiac monitor 10/09/2022 personally reviewed Normal sinus rhythm with predominantly 2-1 AV block Patient had a min HR of 27 bpm, max HR of 81 bpm, and avg HR of 45 bpm.  Possible Atrial Tachycardia with variable block was present.  Chronotropic incompetence appreciated (bradycardia with walking) 6041 episode(s) of AV Block (2nd Mobitz II, High Grade, and 3rd) occurred, lasting a total of 9 days 9 hours.  Isolated SVEs were rare (<1.0%), and no SVE Couplets or SVE Triplets were present.  Isolated VEs  were rare (<1.0%), and no VE Couplets or VE Triplets were present.   ASSESSMENT AND PLAN:  1.  2-1 second-degree AV block: Found on EKG and confirmed on Zio monitor.  Status post Medtronic dual-chamber pacemaker implanted 11/02/2022.  Device functioning appropriately.  Lead impedance, threshold, sensing within normal limits.  No changes.   Current medicines are reviewed at length with the patient today.   The patient does not have concerns regarding her medicines.  The following changes were made today:  none  Labs/ tests ordered today include:  Orders Placed This Encounter  Procedures   EKG 12-Lead     Disposition:   FU 12 months  Signed, Eljay Lave Meredith Leeds, MD  02/06/2023 4:02 PM     [address] [place] [address] [phone] (office) [phone] (fax)

## 2023-03-18 DIAGNOSIS — H353131 Nonexudative age-related macular degeneration, bilateral, early dry stage: Secondary | ICD-10-CM | POA: Diagnosis not present

## 2023-03-18 DIAGNOSIS — H401131 Primary open-angle glaucoma, bilateral, mild stage: Secondary | ICD-10-CM | POA: Diagnosis not present

## 2023-03-18 NOTE — Progress Notes (Signed)
Remote pacemaker transmission.   

## 2023-05-06 ENCOUNTER — Ambulatory Visit (INDEPENDENT_AMBULATORY_CARE_PROVIDER_SITE_OTHER): Payer: PPO

## 2023-05-06 DIAGNOSIS — I442 Atrioventricular block, complete: Secondary | ICD-10-CM | POA: Diagnosis not present

## 2023-05-07 LAB — CUP PACEART REMOTE DEVICE CHECK
Battery Remaining Longevity: 143 mo
Battery Voltage: 3.14 V
Brady Statistic AP VP Percent: 5.35 %
Brady Statistic AP VS Percent: 0.32 %
Brady Statistic AS VP Percent: 93.98 %
Brady Statistic AS VS Percent: 0.35 %
Brady Statistic RA Percent Paced: 5.65 %
Brady Statistic RV Percent Paced: 99.33 %
Date Time Interrogation Session: 20240601233522
Implantable Lead Connection Status: 753985
Implantable Lead Connection Status: 753985
Implantable Lead Implant Date: 20231201
Implantable Lead Implant Date: 20231201
Implantable Lead Location: 753859
Implantable Lead Location: 753860
Implantable Lead Model: 3830
Implantable Lead Model: 5076
Implantable Pulse Generator Implant Date: 20231201
Lead Channel Impedance Value: 361 Ohm
Lead Channel Impedance Value: 380 Ohm
Lead Channel Impedance Value: 532 Ohm
Lead Channel Impedance Value: 703 Ohm
Lead Channel Pacing Threshold Amplitude: 0.625 V
Lead Channel Pacing Threshold Amplitude: 1.25 V
Lead Channel Pacing Threshold Pulse Width: 0.4 ms
Lead Channel Pacing Threshold Pulse Width: 0.4 ms
Lead Channel Sensing Intrinsic Amplitude: 10.125 mV
Lead Channel Sensing Intrinsic Amplitude: 10.125 mV
Lead Channel Sensing Intrinsic Amplitude: 3.125 mV
Lead Channel Sensing Intrinsic Amplitude: 3.125 mV
Lead Channel Setting Pacing Amplitude: 1.5 V
Lead Channel Setting Pacing Amplitude: 2.5 V
Lead Channel Setting Pacing Pulse Width: 0.4 ms
Lead Channel Setting Sensing Sensitivity: 1.2 mV
Zone Setting Status: 755011
Zone Setting Status: 755011

## 2023-05-28 NOTE — Progress Notes (Signed)
Remote pacemaker transmission.   

## 2023-06-20 DIAGNOSIS — L281 Prurigo nodularis: Secondary | ICD-10-CM | POA: Diagnosis not present

## 2023-06-20 DIAGNOSIS — D2262 Melanocytic nevi of left upper limb, including shoulder: Secondary | ICD-10-CM | POA: Diagnosis not present

## 2023-06-20 DIAGNOSIS — D225 Melanocytic nevi of trunk: Secondary | ICD-10-CM | POA: Diagnosis not present

## 2023-06-20 DIAGNOSIS — D2261 Melanocytic nevi of right upper limb, including shoulder: Secondary | ICD-10-CM | POA: Diagnosis not present

## 2023-06-20 DIAGNOSIS — Z872 Personal history of diseases of the skin and subcutaneous tissue: Secondary | ICD-10-CM | POA: Diagnosis not present

## 2023-06-20 DIAGNOSIS — Z85828 Personal history of other malignant neoplasm of skin: Secondary | ICD-10-CM | POA: Diagnosis not present

## 2023-06-20 DIAGNOSIS — L570 Actinic keratosis: Secondary | ICD-10-CM | POA: Diagnosis not present

## 2023-07-03 ENCOUNTER — Encounter (INDEPENDENT_AMBULATORY_CARE_PROVIDER_SITE_OTHER): Payer: Self-pay

## 2023-07-29 ENCOUNTER — Encounter: Payer: PPO | Admitting: Internal Medicine

## 2023-08-01 ENCOUNTER — Encounter: Payer: Self-pay | Admitting: Internal Medicine

## 2023-08-01 ENCOUNTER — Ambulatory Visit (INDEPENDENT_AMBULATORY_CARE_PROVIDER_SITE_OTHER): Payer: PPO | Admitting: Internal Medicine

## 2023-08-01 VITALS — BP 122/80 | HR 64 | Temp 98.2°F | Ht 60.0 in | Wt 106.2 lb

## 2023-08-01 DIAGNOSIS — M810 Age-related osteoporosis without current pathological fracture: Secondary | ICD-10-CM | POA: Diagnosis not present

## 2023-08-01 DIAGNOSIS — H903 Sensorineural hearing loss, bilateral: Secondary | ICD-10-CM

## 2023-08-01 DIAGNOSIS — Z950 Presence of cardiac pacemaker: Secondary | ICD-10-CM

## 2023-08-01 DIAGNOSIS — R5383 Other fatigue: Secondary | ICD-10-CM | POA: Diagnosis not present

## 2023-08-01 DIAGNOSIS — R7301 Impaired fasting glucose: Secondary | ICD-10-CM

## 2023-08-01 DIAGNOSIS — K581 Irritable bowel syndrome with constipation: Secondary | ICD-10-CM | POA: Diagnosis not present

## 2023-08-01 DIAGNOSIS — I700 Atherosclerosis of aorta: Secondary | ICD-10-CM | POA: Diagnosis not present

## 2023-08-01 DIAGNOSIS — Z0000 Encounter for general adult medical examination without abnormal findings: Secondary | ICD-10-CM | POA: Diagnosis not present

## 2023-08-01 DIAGNOSIS — Z0001 Encounter for general adult medical examination with abnormal findings: Secondary | ICD-10-CM

## 2023-08-01 DIAGNOSIS — E785 Hyperlipidemia, unspecified: Secondary | ICD-10-CM | POA: Diagnosis not present

## 2023-08-01 DIAGNOSIS — Z1231 Encounter for screening mammogram for malignant neoplasm of breast: Secondary | ICD-10-CM

## 2023-08-01 DIAGNOSIS — J320 Chronic maxillary sinusitis: Secondary | ICD-10-CM

## 2023-08-01 LAB — COMPREHENSIVE METABOLIC PANEL
ALT: 13 U/L (ref 0–35)
AST: 19 U/L (ref 0–37)
Albumin: 4.2 g/dL (ref 3.5–5.2)
Alkaline Phosphatase: 87 U/L (ref 39–117)
BUN: 18 mg/dL (ref 6–23)
CO2: 31 mEq/L (ref 19–32)
Calcium: 9.5 mg/dL (ref 8.4–10.5)
Chloride: 102 mEq/L (ref 96–112)
Creatinine, Ser: 0.77 mg/dL (ref 0.40–1.20)
GFR: 75.31 mL/min (ref >60.00)
Glucose, Bld: 96 mg/dL (ref 70–99)
Potassium: 4.4 mEq/L (ref 3.5–5.1)
Sodium: 141 mEq/L (ref 135–145)
Total Bilirubin: 0.5 mg/dL (ref 0.2–1.2)
Total Protein: 6.9 g/dL (ref 6.0–8.3)

## 2023-08-01 LAB — LIPID PANEL
Cholesterol: 202 mg/dL — ABNORMAL HIGH (ref 0–200)
HDL: 55.2 mg/dL (ref >39.00)
LDL Cholesterol: 126 mg/dL — ABNORMAL HIGH (ref 0–99)
NonHDL: 146.93
Total CHOL/HDL Ratio: 4
Triglycerides: 106 mg/dL (ref 0.0–149.0)
VLDL: 21.2 mg/dL (ref 0.0–40.0)

## 2023-08-01 LAB — CBC WITH DIFFERENTIAL/PLATELET
Basophils Absolute: 0 10*3/uL (ref 0.0–0.1)
Basophils Relative: 1 % (ref 0.0–3.0)
Eosinophils Absolute: 0.2 10*3/uL (ref 0.0–0.7)
Eosinophils Relative: 4.1 % (ref 0.0–5.0)
HCT: 43.3 % (ref 36.0–46.0)
Hemoglobin: 14 g/dL (ref 12.0–15.0)
Lymphocytes Relative: 30.8 % (ref 12.0–46.0)
Lymphs Abs: 1.5 10*3/uL (ref 0.7–4.0)
MCHC: 32.3 g/dL (ref 30.0–36.0)
MCV: 93.8 fl (ref 78.0–100.0)
Monocytes Absolute: 0.6 10*3/uL (ref 0.1–1.0)
Monocytes Relative: 13.1 % — ABNORMAL HIGH (ref 3.0–12.0)
Neutro Abs: 2.4 10*3/uL (ref 1.4–7.7)
Neutrophils Relative %: 51 % (ref 43.0–77.0)
Platelets: 230 10*3/uL (ref 150.0–400.0)
RBC: 4.61 Mil/uL (ref 3.87–5.11)
RDW: 13.2 % (ref 11.5–15.5)
WBC: 4.8 10*3/uL (ref 4.0–10.5)

## 2023-08-01 LAB — LDL CHOLESTEROL, DIRECT: Direct LDL: 153 mg/dL

## 2023-08-01 LAB — HEMOGLOBIN A1C: Hgb A1c MFr Bld: 5.9 % (ref 4.6–6.5)

## 2023-08-01 LAB — TSH: TSH: 1.29 u[IU]/mL (ref 0.35–5.50)

## 2023-08-01 MED ORDER — ROSUVASTATIN CALCIUM 10 MG PO TABS: 10.0000 mg | ORAL_TABLET | ORAL | 0 refills | Status: DC

## 2023-08-01 NOTE — Patient Instructions (Signed)
1) Your aortic atherosclerosis places you at increased risk for heart attack and stroke. I am glad that you are willing  to take a trial of Crestor .  Start with one tablet every other day and return in 3-4 weeks for non fasting labs   2) Referral to Erline Hau and his audiologist are in progress

## 2023-08-01 NOTE — Progress Notes (Signed)
Patient ID: Nicole Nixon, female    DOB: October 30, 1946  Age: 77 y.o. MRN: [id number]  The patient is here for annual preventive examination and management of other chronic and acute problems.   The risk factors are reflected in the social history.   The roster of all physicians providing medical care to patient - is listed in the Snapshot section of the chart.   Activities of daily living:  The patient is 100% independent in all ADLs: dressing, toileting, feeding as well as independent mobility   Home safety : The patient has smoke detectors in the home. They wear seatbelts.  There are no unsecured firearms at home. There is no violence in the home.    There is no risks for hepatitis, STDs or HIV. There is no   history of blood transfusion. They have no travel history to infectious disease endemic areas of the world.   The patient has seen their dentist in the last six month. They have seen their eye doctor in the last year. The patinet  confirms that she has been having  hearing difficulty with regard to whispered voices and some television programs.  They have deferred audiologic testing in the last year.  They do not  have excessive sun exposure. Discussed the need for sun protection: hats, long sleeves and use of sunscreen if there is significant sun exposure.    Diet: the importance of a healthy diet is discussed. They do have a healthy diet.   The benefits of regular aerobic exercise were discussed. The patient  exercises  3 to 5 days per week  for  60 minutes.    Depression screen: there are no signs or vegative symptoms of depression- irritability, change in appetite, anhedonia, sadness/tearfullness.   The following portions of the patient's history were reviewed and updated as appropriate: allergies, current medications, past family history, past medical history,  past surgical history, past social history  and problem list.   Visual acuity was not assessed per patient preference  since the patient has regular follow up with an  ophthalmologist. Hearing and body mass index were assessed and reviewed.    During the course of the visit the patient was educated and counseled about appropriate screening and preventive services including : fall prevention , diabetes screening, nutrition counseling, colorectal cancer screening, and recommended immunizations.    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 Doing well .  No syncopal events.   2) up to date on dermatolgy eval uation  3) hearing test failed  today . Discussed referral to [facility]  4) chronic sinus drainage:   accompanied by chronic maxillary sinus pain and halitosis depsite flossing,  flushing and using mouthwash. .  She notes that symtpoms resolved transiently  after treceiving perioperative antibiotics for her pacemaker.  Has not seen  [facility]   since the symptoms return.   5)  Aortic arch atherosclerosis:  Noted by Dr Mariah Milling during review of  prior CT scan from 2015 .Marland Kitchen  Discussed the role of statin therapy in stablizing placque and preventing events.  She is willing to initiate every other day therapy.    Review of Symptoms  Patient denies headache, fevers, malaise, unintentional weight loss, skin rash, eye pain, , sore throat, dysphagia,  hemoptysis , cough, dyspnea, wheezing, chest pain, palpitations, orthopnea, edema, abdominal pain, nausea, melena, diarrhea, constipation, flank pain, dysuria, hematuria, urinary  Frequency, nocturia, numbness, tingling, seizures,  Focal weakness, Loss of  consciousness,  Tremor, insomnia, depression, anxiety, and suicidal ideation.    Physical Exam:  BP 122/80   Pulse 64   Temp 98.2 F (36.8 C) (Oral)   Ht 5' (1.524 m)   Wt 106 lb 3.2 oz (48.2 kg)   SpO2 95%   BMI 20.74 kg/m    Physical Exam Vitals reviewed.  Constitutional:      General: She is not in acute distress.    Appearance: Normal appearance. She is  well-developed and normal weight. She is not ill-appearing, toxic-appearing or diaphoretic.  HENT:     Head: Normocephalic.     Right Ear: Tympanic membrane, ear canal and external ear normal. There is no impacted cerumen.     Left Ear: Tympanic membrane, ear canal and external ear normal. There is no impacted cerumen.     Nose: Nose normal.     Mouth/Throat:     Mouth: Mucous membranes are moist.     Pharynx: Oropharynx is clear.  Eyes:     General: No scleral icterus.       Right eye: No discharge.        Left eye: No discharge.     Conjunctiva/sclera: Conjunctivae normal.     Pupils: Pupils are equal, round, and reactive to light.  Neck:     Thyroid: No thyromegaly.     Vascular: No carotid bruit or JVD.  Cardiovascular:     Rate and Rhythm: Normal rate and regular rhythm.     Heart sounds: Normal heart sounds.  Pulmonary:     Effort: Pulmonary effort is normal. No respiratory distress.     Breath sounds: Normal breath sounds.  Chest:  Breasts:    Breasts are symmetrical.     Right: Normal. No swelling, inverted nipple, mass, nipple discharge, skin change or tenderness.     Left: Normal. No swelling, inverted nipple, mass, nipple discharge, skin change or tenderness.  Abdominal:     General: Bowel sounds are normal.     Palpations: Abdomen is soft. There is no mass.     Tenderness: There is no abdominal tenderness. There is no guarding or rebound.  Musculoskeletal:        General: Normal range of motion.     Cervical back: Normal range of motion and neck supple.  Lymphadenopathy:     Cervical: No cervical adenopathy.     Upper Body:     Right upper body: No supraclavicular, axillary or pectoral adenopathy.     Left upper body: No supraclavicular, axillary or pectoral adenopathy.  Skin:    General: Skin is warm and dry.  Neurological:     General: No focal deficit present.     Mental Status: She is alert and oriented to person, place, and time. Mental status is at  baseline.  Psychiatric:        Mood and Affect: Mood normal.        Behavior: Behavior normal.        Thought Content: Thought content normal.        Judgment: Judgment normal.     Assessment and Plan: Encounter for general adult medical examination with abnormal findings Assessment & Plan: age appropriate education and counseling updated, referrals for preventative services and immunizations addressed, dietary and smoking counseling addressed, most recent labs reviewed.  I have personally reviewed and have noted:   1) the patient's medical and social history 2) The pt's use of alcohol, tobacco, and illicit drugs 3) The patient's current medications  and supplements 4) Functional ability including ADL's, fall risk, home safety risk, hearing and visual impairment 5) Diet and physical activities 6) Evidence for depression or mood disorder 7) The patient's height, weight, and BMI have been recorded in the chart     I have made referrals, and provided counseling and education based on review of the above    Encounter for screening mammogram for malignant neoplasm of breast -     3D Screening Mammogram, Left and Right; Future  Hyperlipidemia LDL goal <130 Assessment & Plan: Using the Framingham risk calculator,  her 10 year risk of coronary artery disease is 8%. However given the presence of AA on 2015 CT and the prognositc implications of this finding, ,  she has agreed to a  trial of rosuvastaitn  Lab Results  Component Value Date   CHOL 202 (H) 08/01/2023   HDL 55.20 08/01/2023   LDLCALC 126 (H) 08/01/2023   LDLDIRECT 153.0 08/01/2023   TRIG 106.0 08/01/2023   CHOLHDL 4 08/01/2023       Orders: -     Lipid panel -     LDL cholesterol, direct -     Comprehensive metabolic panel -     CBC with Differential/Platelet -     Hemoglobin A1c -     Comprehensive metabolic panel; Future -     CK; Future  Fatigue, unspecified type -     CBC with Differential/Platelet -      TSH  Impaired fasting glucose -     Comprehensive metabolic panel -     Hemoglobin A1c  Chronic sinusitis of both maxillary sinuses Assessment & Plan: Referring to ENT for evaluation.   Orders: -     Ambulatory referral to Audiology -     Ambulatory referral to ENT  Sensorineural hearing loss (SNHL) of both ears Assessment & Plan: Suggested by today's whisper test.  Referring to audiology   Osteoporosis, post-menopausal Assessment & Plan: She contiues to defer treatment  in spite of progression of bone loss and referral to  endocrinology with rec to start EVista..   Prior intolerance to forteo  prior use of Alendronate.    S/P placement of cardiac pacemaker Assessment & Plan: She was Symptomatic,  with inappropriate response to walking.  (HR dropped to 39 with ambulation) secondary to Mobitz type 2 AV block.  She received pacemaker in Dec 2023      Irritable bowel syndrome with constipation Assessment & Plan: Endocrinology has recommended use of citrcal instead of calcium carbonate supplements to avoid aggravation of constipation   Abdominal aortic atherosclerosis (HCC) Assessment & Plan: Reviewed findings of prior CT scan today..  Patient is willing to  Initiate statin therpay starting with 10 mg crestor  QOD   Other orders -     Rosuvastatin Calcium; Take 1 tablet (10 mg total) by mouth every other day.  Dispense: 45 tablet; Refill: 0    Return in about 6 months (around 01/31/2024).  Sherlene Shams, MD

## 2023-08-03 DIAGNOSIS — J320 Chronic maxillary sinusitis: Secondary | ICD-10-CM | POA: Insufficient documentation

## 2023-08-03 DIAGNOSIS — H903 Sensorineural hearing loss, bilateral: Secondary | ICD-10-CM | POA: Insufficient documentation

## 2023-08-03 DIAGNOSIS — I700 Atherosclerosis of aorta: Secondary | ICD-10-CM | POA: Insufficient documentation

## 2023-08-03 NOTE — Assessment & Plan Note (Signed)
Reviewed findings of prior CT scan today..  Patient is willing to  Initiate statin therpay starting with 10 mg crestor  QOD

## 2023-08-03 NOTE — Assessment & Plan Note (Signed)

## 2023-08-03 NOTE — Assessment & Plan Note (Signed)
Referring to ENT for evaluation

## 2023-08-03 NOTE — Assessment & Plan Note (Signed)
Endocrinology has recommended use of citrcal instead of calcium carbonate supplements to avoid aggravation of constipation

## 2023-08-03 NOTE — Assessment & Plan Note (Signed)
Using the Framingham risk calculator,  her 10 year risk of coronary artery disease is 8%. However given the presence of AA on 2015 CT and the prognositc implications of this finding, ,  she has agreed to a  trial of rosuvastaitn  Lab Results  Component Value Date   CHOL 202 (H) 08/01/2023   HDL 55.20 08/01/2023   LDLCALC 126 (H) 08/01/2023   LDLDIRECT 153.0 08/01/2023   TRIG 106.0 08/01/2023   CHOLHDL 4 08/01/2023

## 2023-08-03 NOTE — Assessment & Plan Note (Signed)
She was Symptomatic,  with inappropriate response to walking.  (HR dropped to 39 with ambulation) secondary to Mobitz type 2 AV block.  She received pacemaker in Dec 2023

## 2023-08-03 NOTE — Assessment & Plan Note (Signed)
Suggested by today's whisper test.  Referring to audiology

## 2023-08-03 NOTE — Assessment & Plan Note (Addendum)
She contiues to defer treatment  in spite of progression of bone loss and referral to  endocrinology with rec to start EVista..   Prior intolerance to forteo  prior use of Alendronate.

## 2023-08-06 ENCOUNTER — Ambulatory Visit (INDEPENDENT_AMBULATORY_CARE_PROVIDER_SITE_OTHER): Payer: PPO

## 2023-08-06 DIAGNOSIS — I441 Atrioventricular block, second degree: Secondary | ICD-10-CM

## 2023-08-07 LAB — CUP PACEART REMOTE DEVICE CHECK
Battery Remaining Longevity: 131 mo
Battery Voltage: 3.08 V
Brady Statistic AP VP Percent: 10.82 %
Brady Statistic AP VS Percent: 0.5 %
Brady Statistic AS VP Percent: 88.28 %
Brady Statistic AS VS Percent: 0.4 %
Brady Statistic RA Percent Paced: 11.3 %
Brady Statistic RV Percent Paced: 99.1 %
Date Time Interrogation Session: 20240830232100
Implantable Lead Connection Status: 753985
Implantable Lead Connection Status: 753985
Implantable Lead Implant Date: 20231201
Implantable Lead Implant Date: 20231201
Implantable Lead Location: 753859
Implantable Lead Location: 753860
Implantable Lead Model: 3830
Implantable Lead Model: 5076
Implantable Pulse Generator Implant Date: 20231201
Lead Channel Impedance Value: 361 Ohm
Lead Channel Impedance Value: 418 Ohm
Lead Channel Impedance Value: 570 Ohm
Lead Channel Impedance Value: 703 Ohm
Lead Channel Pacing Threshold Amplitude: 0.75 V
Lead Channel Pacing Threshold Amplitude: 1.25 V
Lead Channel Pacing Threshold Pulse Width: 0.4 ms
Lead Channel Pacing Threshold Pulse Width: 0.4 ms
Lead Channel Sensing Intrinsic Amplitude: 3.125 mV
Lead Channel Sensing Intrinsic Amplitude: 3.125 mV
Lead Channel Sensing Intrinsic Amplitude: 9.75 mV
Lead Channel Sensing Intrinsic Amplitude: 9.75 mV
Lead Channel Setting Pacing Amplitude: 1.5 V
Lead Channel Setting Pacing Amplitude: 2.75 V
Lead Channel Setting Pacing Pulse Width: 0.4 ms
Lead Channel Setting Sensing Sensitivity: 1.2 mV
Zone Setting Status: 755011
Zone Setting Status: 755011

## 2023-08-19 NOTE — Progress Notes (Signed)
Remote pacemaker transmission.   

## 2023-08-22 ENCOUNTER — Other Ambulatory Visit (INDEPENDENT_AMBULATORY_CARE_PROVIDER_SITE_OTHER): Payer: PPO

## 2023-08-22 DIAGNOSIS — E785 Hyperlipidemia, unspecified: Secondary | ICD-10-CM

## 2023-08-22 LAB — COMPREHENSIVE METABOLIC PANEL
ALT: 17 U/L (ref 0–35)
AST: 21 U/L (ref 0–37)
Albumin: 4 g/dL (ref 3.5–5.2)
Alkaline Phosphatase: 80 U/L (ref 39–117)
BUN: 21 mg/dL (ref 6–23)
CO2: 30 mEq/L (ref 19–32)
Calcium: 9.4 mg/dL (ref 8.4–10.5)
Chloride: 103 mEq/L (ref 96–112)
Creatinine, Ser: 0.8 mg/dL (ref 0.40–1.20)
GFR: 71.91 mL/min (ref >60.00)
Glucose, Bld: 97 mg/dL (ref 70–99)
Potassium: 4.3 mEq/L (ref 3.5–5.1)
Sodium: 139 mEq/L (ref 135–145)
Total Bilirubin: 0.4 mg/dL (ref 0.2–1.2)
Total Protein: 6.9 g/dL (ref 6.0–8.3)

## 2023-08-22 LAB — CK: Total CK: 49 U/L (ref 7–177)

## 2023-09-05 DIAGNOSIS — L814 Other melanin hyperpigmentation: Secondary | ICD-10-CM | POA: Diagnosis not present

## 2023-09-05 DIAGNOSIS — D485 Neoplasm of uncertain behavior of skin: Secondary | ICD-10-CM | POA: Diagnosis not present

## 2023-09-05 DIAGNOSIS — J329 Chronic sinusitis, unspecified: Secondary | ICD-10-CM | POA: Diagnosis not present

## 2023-09-05 DIAGNOSIS — J300 Vasomotor rhinitis: Secondary | ICD-10-CM | POA: Diagnosis not present

## 2023-09-05 DIAGNOSIS — R0981 Nasal congestion: Secondary | ICD-10-CM | POA: Diagnosis not present

## 2023-09-05 DIAGNOSIS — J309 Allergic rhinitis, unspecified: Secondary | ICD-10-CM | POA: Diagnosis not present

## 2023-09-05 DIAGNOSIS — D0439 Carcinoma in situ of skin of other parts of face: Secondary | ICD-10-CM | POA: Diagnosis not present

## 2023-09-06 DIAGNOSIS — J301 Allergic rhinitis due to pollen: Secondary | ICD-10-CM | POA: Diagnosis not present

## 2023-09-14 ENCOUNTER — Ambulatory Visit
Admission: EM | Admit: 2023-09-14 | Discharge: 2023-09-14 | Disposition: A | Discharge status: Home / Self Care | Payer: PPO | Attending: Emergency Medicine | Admitting: Emergency Medicine

## 2023-09-14 DIAGNOSIS — R300 Dysuria: Secondary | ICD-10-CM | POA: Diagnosis not present

## 2023-09-14 DIAGNOSIS — R3915 Urgency of urination: Secondary | ICD-10-CM | POA: Diagnosis not present

## 2023-09-14 LAB — POCT URINALYSIS DIP (MANUAL ENTRY)
Glucose, UA: NEGATIVE mg/dL
Nitrite, UA: NEGATIVE
Protein Ur, POC: 100 mg/dL — AB
Spec Grav, UA: 1.02
Urobilinogen, UA: 0.2 U/dL
pH, UA: 6

## 2023-09-14 MED ORDER — CEPHALEXIN 500 MG PO CAPS: 500.0000 mg | ORAL_CAPSULE | Freq: Three times a day (TID) | ORAL | 0 refills | Status: AC

## 2023-09-14 NOTE — ED Triage Notes (Signed)
Patient presents to UC for abdominal pressure, urinary urgency, decrease appetite, low grade fever, and dysuria x yesterday. Reports some chills when voiding. Treating pain with tylenol.   Denies hematuria, flank pain, or N/V.

## 2023-09-14 NOTE — ED Provider Notes (Signed)
Renaldo Fiddler    CSN: [id number] Arrival date & time: 09/14/23  1016      History   Chief Complaint Chief Complaint  Patient presents with   Urinary Urgency    HPI PRISTINE GLADHILL is a 77 y.o. female.   Patient presents for evaluation of chills, body aches, lower abdominal pressure, urinary frequency, urgency, dysuria, incomplete bladder emptying that began 1 day ago.  Has attempted use of Tylenol which was somewhat helpful.  Denies presence of hematuria, flank pain or vaginal symptoms.  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 on forehead   Cataract    Glaucoma    Osteoporosis     Patient Active Problem List   Diagnosis Date Noted   Chronic sinusitis of both maxillary sinuses 08/03/2023   Sensorineural hearing loss (SNHL) of both ears 08/03/2023   Abdominal aortic atherosclerosis (HCC) 08/03/2023   S/P placement of cardiac pacemaker 07/25/2022   Hyperlipidemia LDL goal <130 12/16/2016   Encounter for general adult medical examination with abnormal findings 12/16/2016   IBS (irritable bowel syndrome) 07/13/2014   Medicare annual wellness visit, subsequent 04/21/2013   Osteoporosis, post-menopausal 12/16/2012   Chronic insomnia 12/16/2012    Past Surgical History:  Procedure Laterality Date   APPENDECTOMY  1975   CATARACT EXTRACTION, BILATERAL  2016   CESAREAN SECTION  1978   DILATION AND CURETTAGE OF UTERUS  1975   PACEMAKER IMPLANT N/A 11/02/2022   Procedure: PACEMAKER IMPLANT;  Surgeon: Regan Lemming, MD;  Location: [facility];  Service: Cardiovascular;  Laterality: N/A;   TONSILLECTOMY AND ADENOIDECTOMY  1958    OB History   No obstetric history on file.      Home Medications    Prior to Admission medications   Medication Sig Start Date End Date Taking? Authorizing Provider  cephALEXin (KEFLEX) 500 MG capsule Take 1 capsule (500 mg total) by mouth 3 (three) times daily for 5 days.  09/14/23 09/19/23 Yes Aiyonna Lucado, Elita Boone, NP  Multiple Vitamins-Minerals (PRESERVISION AREDS 2) CAPS Take 1 tablet by mouth daily.    [provider]  rosuvastatin (CRESTOR) 10 MG tablet Take 1 tablet (10 mg total) by mouth every other day. 08/01/23   Sherlene Shams, MD  timolol (TIMOPTIC) 0.5 % ophthalmic solution Place 1 drop into both eyes every morning.    [provider]  zinc gluconate 50 MG tablet Take 50 mg by mouth daily. Patient not taking: Reported on 08/01/2023    [provider]    Family History Family History  Adopted: Yes  Problem Relation Age of Onset   Colon cancer Neg Hx     Social History Social History   Tobacco Use   Smoking status: Former    Current packs/day: 0.00    Types: Cigarettes    Start date: 12/16/1966    Quit date: 12/16/1970    Years since quitting: 52.7   Smokeless tobacco: Never   Tobacco comments:    have not smoked since I was 77 years old  Vaping Use   Vaping status: Never Used  Substance Use Topics   Alcohol use: No   Drug use: No     Allergies   Codeine, Erythromycin, Penicillins, Influenza vac split quad, and Prednisone   Review of Systems Review of Systems   Physical Exam Triage Vital Signs ED Triage Vitals  Encounter Vitals Group     BP 09/14/23  1027 128/77     Systolic BP Percentile --      Diastolic BP Percentile --      Pulse Rate 09/14/23 1027 84     Resp 09/14/23 1027 16     Temp 09/14/23 1027 (!) 97.4 F (36.3 C)     Temp Source 09/14/23 1027 Temporal     SpO2 09/14/23 1027 93 %     Weight --      Height --      Head Circumference --      Peak Flow --      Pain Score 09/14/23 1035 0     Pain Loc --      Pain Education --      Exclude from Growth Chart --    No data found.  Updated Vital Signs BP 128/77 (BP Location: Left Arm)   Pulse 84   Temp (!) 97.4 F (36.3 C) (Temporal)   Resp 16   SpO2 93%   Visual Acuity Right Eye Distance:   Left Eye Distance:   Bilateral  Distance:    Right Eye Near:   Left Eye Near:    Bilateral Near:     Physical Exam Constitutional:      Appearance: Normal appearance.  Eyes:     Extraocular Movements: Extraocular movements intact.  Pulmonary:     Effort: Pulmonary effort is normal.  Abdominal:     General: Abdomen is flat. Bowel sounds are normal.     Palpations: Abdomen is soft.     Tenderness: There is abdominal tenderness in the suprapubic area. There is no right CVA tenderness or left CVA tenderness.  Neurological:     Mental Status: She is alert and oriented to person, place, and time.      UC Treatments / Results  Labs (all labs ordered are listed, but only abnormal results are displayed) Labs Reviewed  POCT URINALYSIS DIP (MANUAL ENTRY) - Abnormal; Notable for the following components:      Result Value   Clarity, UA cloudy (*)    Bilirubin, UA small (*)    Ketones, POC UA trace (5) (*)    Blood, UA large (*)    Protein Ur, POC =100 (*)    Leukocytes, UA Large (3+) (*)    All other components within normal limits    EKG   Radiology No results found.  Procedures Procedures (including critical care time)  Medications Ordered in UC Medications - No data to display  Initial Impression / Assessment and Plan / UC Course  I have reviewed the triage vital signs and the nursing notes.  Pertinent labs & imaging results that were available during my care of the patient were reviewed by me and considered in my medical decision making (see chart for details).  Urgency of urination, dysuria  Vitals are stable, no signs of sepsis, suprapubic tenderness noted on exam, patient in no signs of distress nontoxic-appearing, stable for outpatient management, urinalysis showing leukocytes, hematuria, negative for nitrates, sent for culture, discussed findings with patient, prophylactically beginning cephalexin for treatment, recommended additional supportive measures and advised follow-up if symptoms  persist worsen or recur Final Clinical Impressions(s) / UC Diagnoses   Final diagnoses:  Urgency of urination  Dysuria     Discharge Instructions      Your urinalysis shows Danissa Rundle blood cells blood but at this time does not show bacteria your urine will be sent to the lab to determine exactly which bacteria is present, if any  changes need to be made to your medications you will be notified  Begin use of cephalexin every 8 hours for 5 days for prophylactic treatment as you do have symptoms  You may use over-the-counter Pyridium, commonly known as Azo or Cystex to help minimize your symptoms until antibiotic removes bacteria, this medication will turn your urine orange  Increase your fluid intake through use of water  As always practice good hygiene, wiping front to back and avoidance of scented vaginal products to prevent further irritation  If symptoms continue to persist after use of medication or recur please follow-up with urgent care or your primary doctor as needed    ED Prescriptions     Medication Sig Dispense Auth. Provider   cephALEXin (KEFLEX) 500 MG capsule Take 1 capsule (500 mg total) by mouth 3 (three) times daily for 5 days. 15 capsule Ashvik Grundman, Elita Boone, NP      PDMP not reviewed this encounter.   Valinda Hoar, NP 09/14/23 1050

## 2023-09-14 NOTE — Discharge Instructions (Signed)
Your urinalysis shows Nicole Nixon blood cells blood but at this time does not show bacteria your urine will be sent to the lab to determine exactly which bacteria is present, if any changes need to be made to your medications you will be notified  Begin use of cephalexin every 8 hours for 5 days for prophylactic treatment as you do have symptoms  You may use over-the-counter Pyridium, commonly known as Azo or Cystex to help minimize your symptoms until antibiotic removes bacteria, this medication will turn your urine orange  Increase your fluid intake through use of water  As always practice good hygiene, wiping front to back and avoidance of scented vaginal products to prevent further irritation  If symptoms continue to persist after use of medication or recur please follow-up with urgent care or your primary doctor as needed

## 2023-09-16 LAB — URINE CULTURE: Culture: >=100000 — AB

## 2023-10-14 DIAGNOSIS — J329 Chronic sinusitis, unspecified: Secondary | ICD-10-CM | POA: Diagnosis not present

## 2023-10-14 DIAGNOSIS — J300 Vasomotor rhinitis: Secondary | ICD-10-CM | POA: Diagnosis not present

## 2023-10-14 DIAGNOSIS — J309 Allergic rhinitis, unspecified: Secondary | ICD-10-CM | POA: Diagnosis not present

## 2023-10-14 DIAGNOSIS — R0981 Nasal congestion: Secondary | ICD-10-CM | POA: Diagnosis not present

## 2023-10-28 ENCOUNTER — Ambulatory Visit
Admission: RE | Admit: 2023-10-28 | Discharge: 2023-10-28 | Disposition: A | Discharge status: Home / Self Care | Payer: PPO | Source: Ambulatory Visit | Attending: Internal Medicine | Admitting: Internal Medicine

## 2023-10-28 DIAGNOSIS — Z1231 Encounter for screening mammogram for malignant neoplasm of breast: Secondary | ICD-10-CM | POA: Insufficient documentation

## 2023-11-02 LAB — CUP PACEART REMOTE DEVICE CHECK
Battery Remaining Longevity: 136 mo
Battery Voltage: 3.04 V
Brady Statistic AP VP Percent: 8.16 %
Brady Statistic AP VS Percent: 0.24 %
Brady Statistic AS VP Percent: 91.14 %
Brady Statistic AS VS Percent: 0.46 %
Brady Statistic RA Percent Paced: 8.37 %
Brady Statistic RV Percent Paced: 99.3 %
Date Time Interrogation Session: 20241128232637
Implantable Lead Connection Status: 753985
Implantable Lead Connection Status: 753985
Implantable Lead Implant Date: 20231201
Implantable Lead Implant Date: 20231201
Implantable Lead Location: 753859
Implantable Lead Location: 753860
Implantable Lead Model: 3830
Implantable Lead Model: 5076
Implantable Pulse Generator Implant Date: 20231201
Lead Channel Impedance Value: 342 Ohm
Lead Channel Impedance Value: 399 Ohm
Lead Channel Impedance Value: 475 Ohm
Lead Channel Impedance Value: 684 Ohm
Lead Channel Pacing Threshold Amplitude: 0.5 V
Lead Channel Pacing Threshold Amplitude: 1.25 V
Lead Channel Pacing Threshold Pulse Width: 0.4 ms
Lead Channel Pacing Threshold Pulse Width: 0.4 ms
Lead Channel Sensing Intrinsic Amplitude: 4.125 mV
Lead Channel Sensing Intrinsic Amplitude: 4.125 mV
Lead Channel Sensing Intrinsic Amplitude: 9.25 mV
Lead Channel Sensing Intrinsic Amplitude: 9.25 mV
Lead Channel Setting Pacing Amplitude: 1.5 V
Lead Channel Setting Pacing Amplitude: 2.5 V
Lead Channel Setting Pacing Pulse Width: 0.4 ms
Lead Channel Setting Sensing Sensitivity: 1.2 mV
Zone Setting Status: 755011
Zone Setting Status: 755011

## 2023-11-04 ENCOUNTER — Ambulatory Visit (INDEPENDENT_AMBULATORY_CARE_PROVIDER_SITE_OTHER): Payer: PPO

## 2023-11-04 DIAGNOSIS — I441 Atrioventricular block, second degree: Secondary | ICD-10-CM | POA: Diagnosis not present

## 2023-11-07 ENCOUNTER — Other Ambulatory Visit: Payer: Self-pay | Admitting: Internal Medicine

## 2023-11-13 DIAGNOSIS — L570 Actinic keratosis: Secondary | ICD-10-CM | POA: Diagnosis not present

## 2024-01-30 LAB — CUP PACEART REMOTE DEVICE CHECK
Battery Remaining Longevity: 124 mo
Battery Voltage: 3.03 V
Brady Statistic AP VP Percent: 7.71 %
Brady Statistic AP VS Percent: 0.04 %
Brady Statistic AS VP Percent: 92.24 %
Brady Statistic AS VS Percent: 0.01 %
Brady Statistic RA Percent Paced: 7.72 %
Brady Statistic RV Percent Paced: 99.95 %
Date Time Interrogation Session: 20250226210228
Implantable Lead Connection Status: 753985
Implantable Lead Connection Status: 753985
Implantable Lead Implant Date: 20231201
Implantable Lead Implant Date: 20231201
Implantable Lead Location: 753859
Implantable Lead Location: 753860
Implantable Lead Model: 3830
Implantable Lead Model: 5076
Implantable Pulse Generator Implant Date: 20231201
Lead Channel Impedance Value: 323 Ohm
Lead Channel Impedance Value: 380 Ohm
Lead Channel Impedance Value: 494 Ohm
Lead Channel Impedance Value: 684 Ohm
Lead Channel Pacing Threshold Amplitude: 0.625 V
Lead Channel Pacing Threshold Amplitude: 1.375 V
Lead Channel Pacing Threshold Pulse Width: 0.4 ms
Lead Channel Pacing Threshold Pulse Width: 0.4 ms
Lead Channel Sensing Intrinsic Amplitude: 11.75 mV
Lead Channel Sensing Intrinsic Amplitude: 11.75 mV
Lead Channel Sensing Intrinsic Amplitude: 3.875 mV
Lead Channel Sensing Intrinsic Amplitude: 3.875 mV
Lead Channel Setting Pacing Amplitude: 1.5 V
Lead Channel Setting Pacing Amplitude: 2.75 V
Lead Channel Setting Pacing Pulse Width: 0.4 ms
Lead Channel Setting Sensing Sensitivity: 1.2 mV
Zone Setting Status: 755011
Zone Setting Status: 755011

## 2024-01-31 ENCOUNTER — Ambulatory Visit: Payer: PPO | Admitting: Internal Medicine

## 2024-01-31 ENCOUNTER — Encounter: Payer: Self-pay | Admitting: Internal Medicine

## 2024-01-31 VITALS — BP 118/72 | HR 67 | Temp 97.7°F | Wt 111.2 lb

## 2024-01-31 DIAGNOSIS — M810 Age-related osteoporosis without current pathological fracture: Secondary | ICD-10-CM

## 2024-01-31 DIAGNOSIS — M791 Myalgia, unspecified site: Secondary | ICD-10-CM | POA: Diagnosis not present

## 2024-01-31 DIAGNOSIS — E785 Hyperlipidemia, unspecified: Secondary | ICD-10-CM | POA: Diagnosis not present

## 2024-01-31 DIAGNOSIS — F5104 Psychophysiologic insomnia: Secondary | ICD-10-CM

## 2024-01-31 DIAGNOSIS — E559 Vitamin D deficiency, unspecified: Secondary | ICD-10-CM | POA: Diagnosis not present

## 2024-01-31 DIAGNOSIS — I700 Atherosclerosis of aorta: Secondary | ICD-10-CM

## 2024-01-31 LAB — COMPREHENSIVE METABOLIC PANEL
ALT: 11 U/L (ref 0–35)
AST: 16 U/L (ref 0–37)
Albumin: 4.2 g/dL (ref 3.5–5.2)
Alkaline Phosphatase: 73 U/L (ref 39–117)
BUN: 26 mg/dL — ABNORMAL HIGH (ref 6–23)
CO2: 29 meq/L (ref 19–32)
Calcium: 9.2 mg/dL (ref 8.4–10.5)
Chloride: 105 meq/L (ref 96–112)
Creatinine, Ser: 0.7 mg/dL (ref 0.40–1.20)
GFR: 84.14 mL/min (ref >60.00)
Glucose, Bld: 93 mg/dL (ref 70–99)
Potassium: 4 meq/L (ref 3.5–5.1)
Sodium: 141 meq/L (ref 135–145)
Total Bilirubin: 0.4 mg/dL (ref 0.2–1.2)
Total Protein: 7 g/dL (ref 6.0–8.3)

## 2024-01-31 LAB — LIPID PANEL
Cholesterol: 159 mg/dL (ref 0–200)
HDL: 59.7 mg/dL (ref >39.00)
LDL Cholesterol: 85 mg/dL (ref 0–99)
NonHDL: 98.85
Total CHOL/HDL Ratio: 3
Triglycerides: 67 mg/dL (ref 0.0–149.0)
VLDL: 13.4 mg/dL (ref 0.0–40.0)

## 2024-01-31 LAB — LDL CHOLESTEROL, DIRECT: Direct LDL: 78 mg/dL

## 2024-01-31 LAB — VITAMIN D 25 HYDROXY (VIT D DEFICIENCY, FRACTURES): VITD: 35.71 ng/mL (ref 30.00–100.00)

## 2024-01-31 LAB — CK: Total CK: 46 U/L (ref 7–177)

## 2024-01-31 NOTE — Patient Instructions (Signed)
 Please reconsider therap to improve your bone density to prevent fractures  EVista (raloxifene) and Prolia (denosumab) are options available to you  You should start taking one calcium supplement daily

## 2024-01-31 NOTE — Assessment & Plan Note (Addendum)
 She continues to defer resumption treatment wit Prolia or EvisTA  in spite of progression of bone loss and referral to  endocrinology who agreed with  rec to start EVista..   she has had Prior intolerance to forteo  prior to use of Alendronate. reviewed options and last DEXA today

## 2024-01-31 NOTE — Progress Notes (Signed)
 Subjective:  Patient ID: Nicole Nixon, female    DOB: [birth year]  Age: 78 y.o. MRN: [id number]  CC: The primary encounter diagnosis was Vitamin D deficiency. Diagnoses of Osteoporosis, post-menopausal, Myalgia, Hyperlipidemia LDL goal <130, Chronic insomnia, Abdominal aortic atherosclerosis (HCC), and Hyperlipidemia LDL goal <70 were also pertinent to this visit.   HPI Nicole Nixon presents for  Chief Complaint  Patient presents with   Medical Management of Chronic Issues    1) HLD: tolerating  Crestor  after a weeks of  muscle pain which have resolved    2) had a viral illness with Nausea and diarrhea  lasted one day   with low back pain;  all resolved    resolved.   Outpatient Medications Prior to Visit  Medication Sig Dispense Refill   Multiple Vitamins-Minerals (PRESERVISION AREDS 2) CAPS Take 1 tablet by mouth daily.     rosuvastatin (CRESTOR) 10 MG tablet TAKE ONE TABLET BY MOUTH EVERY OTHER DAY 45 tablet 0   timolol (TIMOPTIC) 0.5 % ophthalmic solution Place 1 drop into both eyes every morning.     zinc gluconate 50 MG tablet Take 50 mg by mouth daily. (Patient not taking: Reported on 08/01/2023)     Facility-Administered Medications Prior to Visit  Medication Dose Route Frequency Provider Last Rate Last Admin   0.9 %  sodium chloride infusion  500 mL Intravenous Continuous Pyrtle, Carie Caddy, MD        Review of Systems;  Patient denies headache, fevers, malaise, unintentional weight loss, skin rash, eye pain, sinus congestion and sinus pain, sore throat, dysphagia,  hemoptysis , cough, dyspnea, wheezing, chest pain, palpitations, orthopnea, edema, abdominal pain, nausea, melena, diarrhea, constipation, flank pain, dysuria, hematuria, urinary  Frequency, nocturia, numbness, tingling, seizures,  Focal weakness, Loss of consciousness,  Tremor, insomnia, depression, anxiety, and suicidal ideation.      Objective:  BP 118/72   Pulse 67   Temp 97.7 F (36.5 C)   Wt  111 lb 3.2 oz (50.4 kg)   SpO2 98%   BMI 21.72 kg/m   BP Readings from Last 3 Encounters:  01/31/24 118/72  09/14/23 128/77  08/01/23 122/80    Wt Readings from Last 3 Encounters:  01/31/24 111 lb 3.2 oz (50.4 kg)  08/01/23 106 lb 3.2 oz (48.2 kg)  02/06/23 109 lb (49.4 kg)    Physical Exam  Lab Results  Component Value Date   HGBA1C 5.9 08/01/2023   HGBA1C 6.0 07/25/2022   HGBA1C 6.1 08/11/2019    Lab Results  Component Value Date   CREATININE 0.70 01/31/2024   CREATININE 0.80 08/22/2023   CREATININE 0.77 08/01/2023    Lab Results  Component Value Date   WBC 4.8 08/01/2023   HGB 14.0 08/01/2023   HCT 43.3 08/01/2023   PLT 230.0 08/01/2023   GLUCOSE 93 01/31/2024   CHOL 159 01/31/2024   TRIG 67.0 01/31/2024   HDL 59.70 01/31/2024   LDLDIRECT 78.0 01/31/2024   LDLCALC 85 01/31/2024   ALT 11 01/31/2024   AST 16 01/31/2024   NA 141 01/31/2024   K 4.0 01/31/2024   CL 105 01/31/2024   CREATININE 0.70 01/31/2024   BUN 26 (H) 01/31/2024   CO2 29 01/31/2024   TSH 1.29 08/01/2023   HGBA1C 5.9 08/01/2023    MM 3D SCREENING MAMMOGRAM BILATERAL BREAST Result Date: 10/29/2023 CLINICAL DATA:  Screening. EXAM: DIGITAL SCREENING BILATERAL MAMMOGRAM WITH TOMOSYNTHESIS AND CAD TECHNIQUE: Bilateral screening digital craniocaudal and mediolateral oblique mammograms  were obtained. Bilateral screening digital breast tomosynthesis was performed. The images were evaluated with computer-aided detection. COMPARISON:  Previous exam(s). ACR Breast Density Category b: There are scattered areas of fibroglandular density. FINDINGS: There are no findings suspicious for malignancy. IMPRESSION: No mammographic evidence of malignancy. A result letter of this screening mammogram will be mailed directly to the patient. RECOMMENDATION: Screening mammogram in one year. (Code:SM-B-01Y) BI-RADS CATEGORY  1: Negative. Electronically Signed   By: Harmon Pier M.D.   On: 10/29/2023 13:46     Assessment & Plan:  .Vitamin D deficiency -     VITAMIN D 25 Hydroxy (Vit-D Deficiency, Fractures)  Osteoporosis, post-menopausal Assessment & Plan: She continues to defer resumption treatment wit Prolia or EvisTA  in spite of progression of bone loss and referral to  endocrinology who agreed with  rec to start EVista..   she has had Prior intolerance to forteo  prior to use of Alendronate. reviewed options and last DEXA today    Myalgia -     CK  Hyperlipidemia LDL goal <130 Assessment & Plan: Statin therapy was started due to presence of aortic atherosclerosis and is at goal  Lab Results  Component Value Date   CHOL 159 01/31/2024   HDL 59.70 01/31/2024   LDLCALC 85 01/31/2024   LDLDIRECT 78.0 01/31/2024   TRIG 67.0 01/31/2024   CHOLHDL 3 01/31/2024   Lab Results  Component Value Date   CKTOTAL 46 01/31/2024   Lab Results  Component Value Date   ALT 11 01/31/2024   AST 16 01/31/2024   ALKPHOS 73 01/31/2024   BILITOT 0.4 01/31/2024     Orders: -     Comprehensive metabolic panel -     LDL cholesterol, direct -     Lipid panel  Chronic insomnia Assessment & Plan: Averaging 6 to 6.5 hours  Per night ,  No daytime somnolence.  No anxiety. She declines pharmacotherapy   Abdominal aortic atherosclerosis (HCC) Assessment & Plan: Reviewed findings of prior CT scan today..  Patient is tolerating statin therpay 10 mg crestor  every other day  Lab Results  Component Value Date   CHOL 159 01/31/2024   HDL 59.70 01/31/2024   LDLCALC 85 01/31/2024   LDLDIRECT 78.0 01/31/2024   TRIG 67.0 01/31/2024   CHOLHDL 3 01/31/2024      Hyperlipidemia LDL goal <70 Assessment & Plan: Statin therapy was started due to presence of aortic atherosclerosis and is at goal  Lab Results  Component Value Date   CHOL 159 01/31/2024   HDL 59.70 01/31/2024   LDLCALC 85 01/31/2024   LDLDIRECT 78.0 01/31/2024   TRIG 67.0 01/31/2024   CHOLHDL 3 01/31/2024   Lab Results   Component Value Date   CKTOTAL 46 01/31/2024   Lab Results  Component Value Date   ALT 11 01/31/2024   AST 16 01/31/2024   ALKPHOS 73 01/31/2024   BILITOT 0.4 01/31/2024        Follow-up: Return in about 6 months (around 07/30/2024) for physical.   Sherlene Shams, MD

## 2024-02-02 ENCOUNTER — Encounter: Payer: Self-pay | Admitting: Internal Medicine

## 2024-02-02 NOTE — Assessment & Plan Note (Signed)
 Statin therapy was started due to presence of aortic atherosclerosis and is at goal  Lab Results  Component Value Date   CHOL 159 01/31/2024   HDL 59.70 01/31/2024   LDLCALC 85 01/31/2024   LDLDIRECT 78.0 01/31/2024   TRIG 67.0 01/31/2024   CHOLHDL 3 01/31/2024   Lab Results  Component Value Date   CKTOTAL 46 01/31/2024   Lab Results  Component Value Date   ALT 11 01/31/2024   AST 16 01/31/2024   ALKPHOS 73 01/31/2024   BILITOT 0.4 01/31/2024

## 2024-02-02 NOTE — Assessment & Plan Note (Signed)
Averaging 6 to 6.5 hours  Per night ,  No daytime somnolence.  No anxiety. She declines pharmacotherapy

## 2024-02-02 NOTE — Assessment & Plan Note (Addendum)
 Reviewed findings of prior CT scan today..  Patient is tolerating statin therpay 10 mg crestor  every other day  Lab Results  Component Value Date   CHOL 159 01/31/2024   HDL 59.70 01/31/2024   LDLCALC 85 01/31/2024   LDLDIRECT 78.0 01/31/2024   TRIG 67.0 01/31/2024   CHOLHDL 3 01/31/2024

## 2024-02-02 NOTE — Progress Notes (Deleted)
 Cardiology Office Note  Date:  02/02/2024   ID:  Nicole Nixon, Nicole Nixon, MRN [id number]  PCP:  Sherlene Shams, MD   No chief complaint on file.   HPI:  Nicole Nixon is a 78 year old woman with past medical history of Cancer Remote smoking, age Osteoporosis Chronic insomnia Hyperlipidemia AV block, status post pacer Medtronic [id number] Azure XT DR MRI  Who presents for follow-up of her bradycardia, Mobitz type II second-degree AV block, status post pacemaker  Last seen by myself in clinic October 2023  Seen by Dr. Darrick Huntsman July 25, 2022 EKG obtained documenting sinus bradycardia with AV block  Seen in our office, Zio monitor ordered Zio monitor with concern for high degree AV block November 2023 Was referred to EP Pacemaker placed for symptoms of fatigue, weakness, AV block  In follow-up reports that she feels well, no complaints  CT abdomen pulled up from 2015 showing mild scattered aortic atherosclerosis Total cholesterol around 200  EKG personally reviewed by myself on todays visit Sinus rhythm atrial sensed V paced rate 79 bpm  PMH:   has a past medical history of Allergy, Cancer (HCC) (2009), Cataract, Glaucoma, and Osteoporosis.  PSH:    Past Surgical History:  Procedure Laterality Date   APPENDECTOMY  1975   CATARACT EXTRACTION, BILATERAL  2016   CESAREAN SECTION  1978   DILATION AND CURETTAGE OF UTERUS  1975   PACEMAKER IMPLANT N/A 11/02/2022   Procedure: PACEMAKER IMPLANT;  Surgeon: Regan Lemming, MD;  Location: [facility];  Service: Cardiovascular;  Laterality: N/A;   TONSILLECTOMY AND ADENOIDECTOMY  1958    Current Outpatient Medications  Medication Sig Dispense Refill   Multiple Vitamins-Minerals (PRESERVISION AREDS 2) CAPS Take 1 tablet by mouth daily.     rosuvastatin (CRESTOR) 10 MG tablet TAKE ONE TABLET BY MOUTH EVERY OTHER DAY 45 tablet 0   timolol (TIMOPTIC) 0.5 % ophthalmic solution Place 1 drop into both eyes every  morning.     zinc gluconate 50 MG tablet Take 50 mg by mouth daily. (Patient not taking: Reported on 08/01/2023)     Current Facility-Administered Medications  Medication Dose Route Frequency Provider Last Rate Last Admin   0.9 %  sodium chloride infusion  500 mL Intravenous Continuous Pyrtle, Carie Caddy, MD        Allergies:   Codeine, Erythromycin, Penicillins, Influenza vac split quad, and Prednisone   Social History:  The patient  reports that she quit smoking about 53 years ago. Her smoking use included cigarettes. She started smoking about 57 years ago. She has never used smokeless tobacco. She reports that she does not drink alcohol and does not use drugs.   Family History:   family history is not on file. She was adopted.    Review of Systems: Review of Systems  Constitutional: Negative.   HENT: Negative.    Respiratory: Negative.    Cardiovascular: Negative.   Gastrointestinal: Negative.   Musculoskeletal: Negative.   Neurological: Negative.   Psychiatric/Behavioral: Negative.    All other systems reviewed and are negative.   PHYSICAL EXAM: VS:  There were no vitals taken for this visit. , BMI There is no height or weight on file to calculate BMI. Constitutional:  oriented to person, place, and time. No distress.  HENT:  Head: Grossly normal Eyes:  no discharge. No scleral icterus.  Neck: No JVD, no carotid bruits  Cardiovascular: Regular rate and rhythm, no murmurs appreciated Pulmonary/Chest: Clear to auscultation bilaterally,  no wheezes or rails Abdominal: Soft.  no distension.  no tenderness.  Musculoskeletal: Normal range of motion Neurological:  normal muscle tone. Coordination normal. No atrophy Skin: Skin warm and dry Psychiatric: normal affect, pleasant  Recent Labs: 08/01/2023: Hemoglobin 14.0; Platelets 230.0; TSH 1.29 01/31/2024: ALT 11; BUN 26; Creatinine, Ser 0.70; Potassium 4.0; Sodium 141    Lipid Panel Lab Results  Component Value Date   CHOL 159  01/31/2024   HDL 59.70 01/31/2024   LDLCALC 85 01/31/2024   TRIG 67.0 01/31/2024      Wt Readings from Last 3 Encounters:  01/31/24 111 lb 3.2 oz (50.4 kg)  08/01/23 106 lb 3.2 oz (48.2 kg)  02/06/23 109 lb (49.4 kg)      ASSESSMENT AND PLAN:  Problem List Items Addressed This Visit   None  Symptomatic bradycardia/AV block Zio monitor with concern for high degree AV block November 2023 Was referred to EP, Pacemaker placed for symptoms of fatigue, weakness, AV block Feels well on today's visit No medication changes made  Aortic atherosclerosis Mild on CT scan 2015, images pulled up and reviewed Lipid management with medication offered, she would like to think about it and discussed with Dr. Darrick Huntsman Aortic atherosclerosis is very mild, punctate spots diffusely Current non-smoker, no diabetes Recommend she call us if she would like to start low-dose statin   Total encounter time more than 30 minutes  Greater than 50% was spent in counseling and coordination of care with the patient    Signed, Dossie Arbour, M.D., Ph.D. [facility] [place], [place] [phone]

## 2024-02-03 ENCOUNTER — Ambulatory Visit: Payer: PPO | Admitting: Cardiovascular Disease

## 2024-02-03 ENCOUNTER — Ambulatory Visit: Payer: PPO

## 2024-02-03 DIAGNOSIS — I700 Atherosclerosis of aorta: Secondary | ICD-10-CM

## 2024-02-03 DIAGNOSIS — I442 Atrioventricular block, complete: Secondary | ICD-10-CM

## 2024-02-03 DIAGNOSIS — E785 Hyperlipidemia, unspecified: Secondary | ICD-10-CM

## 2024-02-03 DIAGNOSIS — R001 Bradycardia, unspecified: Secondary | ICD-10-CM

## 2024-02-03 DIAGNOSIS — I441 Atrioventricular block, second degree: Secondary | ICD-10-CM

## 2024-02-03 DIAGNOSIS — Z950 Presence of cardiac pacemaker: Secondary | ICD-10-CM

## 2024-02-03 DIAGNOSIS — R0602 Shortness of breath: Secondary | ICD-10-CM

## 2024-02-03 DIAGNOSIS — I491 Atrial premature depolarization: Secondary | ICD-10-CM

## 2024-02-07 ENCOUNTER — Other Ambulatory Visit: Payer: Self-pay | Admitting: Internal Medicine

## 2024-02-18 ENCOUNTER — Encounter: Payer: Self-pay | Admitting: Cardiology

## 2024-02-18 ENCOUNTER — Ambulatory Visit: Payer: PPO | Attending: Cardiology | Admitting: Cardiology

## 2024-02-18 VITALS — BP 140/92 | HR 63 | Ht 60.0 in | Wt 111.0 lb

## 2024-02-18 DIAGNOSIS — R001 Bradycardia, unspecified: Secondary | ICD-10-CM

## 2024-02-18 DIAGNOSIS — I441 Atrioventricular block, second degree: Secondary | ICD-10-CM

## 2024-02-18 DIAGNOSIS — I442 Atrioventricular block, complete: Secondary | ICD-10-CM | POA: Diagnosis not present

## 2024-02-18 LAB — CUP PACEART INCLINIC DEVICE CHECK
Battery Remaining Longevity: 113 mo
Battery Voltage: 3.02 V
Brady Statistic AP VP Percent: 8.27 %
Brady Statistic AP VS Percent: 0.26 %
Brady Statistic AS VP Percent: 91.18 %
Brady Statistic AS VS Percent: 0.29 %
Brady Statistic RA Percent Paced: 8.51 %
Brady Statistic RV Percent Paced: 99.45 %
Date Time Interrogation Session: 20250318142813
Implantable Lead Connection Status: 753985
Implantable Lead Connection Status: 753985
Implantable Lead Implant Date: 20231201
Implantable Lead Implant Date: 20231201
Implantable Lead Location: 753859
Implantable Lead Location: 753860
Implantable Lead Model: 3830
Implantable Lead Model: 5076
Implantable Pulse Generator Implant Date: 20231201
Lead Channel Impedance Value: 323 Ohm
Lead Channel Impedance Value: 361 Ohm
Lead Channel Impedance Value: 494 Ohm
Lead Channel Impedance Value: 646 Ohm
Lead Channel Pacing Threshold Amplitude: 0.625 V
Lead Channel Pacing Threshold Amplitude: 1.25 V
Lead Channel Pacing Threshold Amplitude: 1.625 V
Lead Channel Pacing Threshold Pulse Width: 0.4 ms
Lead Channel Pacing Threshold Pulse Width: 0.4 ms
Lead Channel Pacing Threshold Pulse Width: 0.4 ms
Lead Channel Sensing Intrinsic Amplitude: 10.125 mV
Lead Channel Sensing Intrinsic Amplitude: 10.5 mV
Lead Channel Sensing Intrinsic Amplitude: 2.375 mV
Lead Channel Sensing Intrinsic Amplitude: 2.875 mV
Lead Channel Setting Pacing Amplitude: 1.5 V
Lead Channel Setting Pacing Amplitude: 3.25 V
Lead Channel Setting Pacing Pulse Width: 0.4 ms
Lead Channel Setting Sensing Sensitivity: 1.2 mV
Zone Setting Status: 755011
Zone Setting Status: 755011

## 2024-02-18 NOTE — Progress Notes (Signed)
  Electrophysiology Office Note:   Date:  02/18/2024  ID:  Nicole Nixon, DOB [birth year], MRN [id number]  Primary Cardiologist: None Primary Heart Failure: None Electrophysiologist: Basha Krygier Jorja Loa, MD      History of Present Illness:   Nicole Nixon is a 78 y.o. female with h/o heart block seen today for routine electrophysiology followup.   Since last being seen in our clinic the patient reports doing overall well.  She has no acute complaints.  She is able to do her daily activities..  she denies chest pain, palpitations, dyspnea, PND, orthopnea, nausea, vomiting, dizziness, syncope, edema, weight gain, or early satiety.   Review of systems complete and found to be negative unless listed in HPI.      EP Information / Studies Reviewed:    EKG is ordered today. Personal review as below.  EKG Interpretation Date/Time:  Tuesday February 18 2024 14:03:56 EDT Ventricular Rate:  63 PR Interval:  154 QRS Duration:  122 QT Interval:  438 QTC Calculation: 448 R Axis:   -83  Text Interpretation: Atrial-sensed ventricular-paced rhythm When compared with ECG of 02-Nov-2022 19:07, No significant change since last tracing Confirmed by Evrett Hakim ([id number]) on 02/18/2024 2:09:04 PM   PPM Interrogation-  reviewed in detail today,  See PACEART report.  Device History: Medtronic Dual Chamber PPM implanted 01/03/2022 for Second Degree AV block  Risk Assessment/Calculations:             Physical Exam:   VS:  BP (!) 140/92 (BP Location: Right Arm, Patient Position: Sitting, Cuff Size: Normal)   Pulse 63   Ht 5' (1.524 m)   Wt 111 lb (50.3 kg)   SpO2 95%   BMI 21.68 kg/m    Wt Readings from Last 3 Encounters:  02/18/24 111 lb (50.3 kg)  01/31/24 111 lb 3.2 oz (50.4 kg)  08/01/23 106 lb 3.2 oz (48.2 kg)     GEN: Well nourished, well developed in no acute distress NECK: No JVD; No carotid bruits CARDIAC: Regular rate and rhythm, no murmurs, rubs, gallops RESPIRATORY:   Clear to auscultation without rales, wheezing or rhonchi  ABDOMEN: Soft, non-tender, non-distended EXTREMITIES:  No edema; No deformity   ASSESSMENT AND PLAN:    Second Degree AV block s/p Medtronic PPM  Normal PPM function See Pace Art report Sensing, threshold, impedance within normal limits Programming appropriate for patient No changes today    Disposition:   Follow up with EP APP in 12 months  Signed, Aren Cherne Jorja Loa, MD

## 2024-03-06 NOTE — Progress Notes (Signed)
 Remote pacemaker transmission.

## 2024-03-15 NOTE — Progress Notes (Unsigned)
 Cardiology Office Note  Date:  03/16/2024   ID:  Nicole NICOLLS, DOB 1946/10/02, MRN [id number]  PCP:  Nicole Flax, MD   Chief Complaint  Patient presents with   12 month follow up     "Doing well."     HPI:  [name] is a 78 year old woman with past medical history of Cancer Remote smoking Osteoporosis Chronic insomnia Hyperlipidemia Mild scattered aortic atherosclerosis on CT AV block, status post pacer Medtronic [id number] Azure XT DR MRI  Who presents for follow-up of her bradycardia, Mobitz type II second-degree AV block, status post pacemaker  Last seen by myself in clinic 1/24 Seen by EP March 2025 Pacemaker functioning normally  Walking for exercise Good exercise tolerance, no chest pain or shortness of breath Tolerating Crestor 10 mg every other day  Helping to take care of her husband who has medical issues, GI pathology  Denies any orthostasis, no near-syncope or syncope  Pacemaker downloads reviewed, no significant arrhythmia noted  EKG personally reviewed by myself on todays visit EKG Interpretation Date/Time:  Monday March 16 2024 10:31:07 EDT Ventricular Rate:  67 PR Interval:  150 QRS Duration:  88 QT Interval:  404 QTC Calculation: 426 R Axis:   -10  Text Interpretation: Atrial-sensed ventricular-paced rhythm When compared with ECG of 18-Feb-2024 14:03, Vent. rate has increased BY   4 BPM Confirmed by Nicole Nixon [phone]) on 03/16/2024 10:37:30 AM    Zio monitor with concern for high degree AV block November 2023 Pacemaker placed for symptoms of fatigue, weakness, AV block  CT abdomen pulled up from 2015 showing mild scattered aortic atherosclerosis Total cholesterol around 200  Lab work reviewed LDL 78 total cholesterol 159 down from 202 Normal CMP  PMH:   has a past medical history of Allergy, Cancer (HCC) (2009), Cataract, Glaucoma, and Osteoporosis.  PSH:    Past Surgical History:  Procedure Laterality Date    APPENDECTOMY  1975   CATARACT EXTRACTION, BILATERAL  2016   CESAREAN SECTION  1978   DILATION AND CURETTAGE OF UTERUS  1975   PACEMAKER IMPLANT N/A 11/02/2022   Procedure: PACEMAKER IMPLANT;  Surgeon: Nicole Pump, MD;  Location: [facility];  Service: Cardiovascular;  Laterality: N/A;   TONSILLECTOMY AND ADENOIDECTOMY  1958   Current Outpatient Medications  Medication Sig Dispense Refill   HYDROcodone-acetaminophen (NORCO/VICODIN) 5-325 MG tablet Take 1 tablet by mouth every 4 (four) hours as needed.     Multiple Vitamins-Minerals (PRESERVISION AREDS 2) CAPS Take 1 tablet by mouth daily.     rosuvastatin (CRESTOR) 10 MG tablet TAKE ONE TABLET BY MOUTH EVERY OTHER DAY 45 tablet 1   timolol (TIMOPTIC) 0.5 % ophthalmic solution Place 1 drop into both eyes every morning.     Current Facility-Administered Medications  Medication Dose Route Frequency Provider Last Rate Last Admin   0.9 %  sodium chloride infusion  500 mL Intravenous Continuous Nixon, Nicole Bail, MD        Allergies:   Codeine, Erythromycin, Penicillins, Influenza vac split quad, and Prednisone   Social History:  The patient  reports that she quit smoking about 53 years ago. Her smoking use included cigarettes. She started smoking about 57 years ago. She has never used smokeless tobacco. She reports that she does not drink alcohol and does not use drugs.   Family History:   family history is not on file. She was adopted.   Review of Systems: Review of Systems  Constitutional: Negative.  HENT: Negative.    Respiratory: Negative.    Cardiovascular: Negative.   Gastrointestinal: Negative.   Musculoskeletal: Negative.   Neurological: Negative.   Psychiatric/Behavioral: Negative.    All other systems reviewed and are negative.  PHYSICAL EXAM: VS:  BP 130/70 (BP Location: Left Arm, Patient Position: Sitting, Cuff Size: Normal)   Pulse 67   Ht 5' (1.524 m)   Wt 110 lb 6 oz (50.1 kg)   SpO2 96%   BMI 21.56  kg/m  , BMI Body mass index is 21.56 kg/m. Constitutional:  oriented to person, place, and time. No distress.  HENT:  Head: Grossly normal Eyes:  no discharge. No scleral icterus.  Neck: No JVD, no carotid bruits  Cardiovascular: Regular rate and rhythm, no murmurs appreciated Pulmonary/Chest: Clear to auscultation bilaterally, no wheezes or rails Abdominal: Soft.  no distension.  no tenderness.  Musculoskeletal: Normal range of motion Neurological:  normal muscle tone. Coordination normal. No atrophy Skin: Skin warm and dry Psychiatric: normal affect, pleasant  Recent Labs: 08/01/2023: Hemoglobin 14.0; Platelets 230.0; TSH 1.29 01/31/2024: ALT 11; BUN 26; Creatinine, Ser 0.70; Potassium 4.0; Sodium 141    Lipid Panel Lab Results  Component Value Date   CHOL 159 01/31/2024   HDL 59.70 01/31/2024   LDLCALC 85 01/31/2024   TRIG 67.0 01/31/2024      Wt Readings from Last 3 Encounters:  03/16/24 110 lb 6 oz (50.1 kg)  02/18/24 111 lb (50.3 kg)  01/31/24 111 lb 3.2 oz (50.4 kg)    ASSESSMENT AND PLAN:  Problem List Items Addressed This Visit       Cardiology Problems   Hyperlipidemia LDL goal <70   Abdominal aortic atherosclerosis (HCC)   Relevant Orders   EKG 12-Lead (Completed)   Other Visit Diagnoses       Bradycardia    -  Primary   Relevant Orders   EKG 12-Lead (Completed)     Second degree AV block       Relevant Orders   EKG 12-Lead (Completed)     Heart block AV complete (HCC)       Relevant Orders   EKG 12-Lead (Completed)     Shortness of breath       Relevant Orders   EKG 12-Lead (Completed)     PAC (premature atrial contraction)       Relevant Orders   EKG 12-Lead (Completed)     Hyperlipidemia LDL goal <130         AV block          Symptomatic bradycardia/AV block Zio monitor with concern for high degree AV block November 2023 Pacemaker placed for symptoms of fatigue, weakness, AV block Followed by EP, pacer functioning well, no  symptoms  Aortic atherosclerosis Mild on CT scan 2015, Aortic atherosclerosis is very mild, punctate spots diffusely Current non-smoker, no diabetes Tolerating Crestor 10 every other day  cholesterol down to 150 from 200    Signed, Juanda Noon, M.D., Ph.D. [facility] [place], [place] [phone]

## 2024-03-16 ENCOUNTER — Ambulatory Visit: Attending: Cardiovascular Disease | Admitting: Cardiovascular Disease

## 2024-03-16 ENCOUNTER — Encounter: Payer: Self-pay | Admitting: Cardiovascular Disease

## 2024-03-16 VITALS — BP 130/70 | HR 67 | Ht 60.0 in | Wt 110.4 lb

## 2024-03-16 DIAGNOSIS — R0602 Shortness of breath: Secondary | ICD-10-CM

## 2024-03-16 DIAGNOSIS — I700 Atherosclerosis of aorta: Secondary | ICD-10-CM

## 2024-03-16 DIAGNOSIS — I441 Atrioventricular block, second degree: Secondary | ICD-10-CM

## 2024-03-16 DIAGNOSIS — I491 Atrial premature depolarization: Secondary | ICD-10-CM | POA: Diagnosis not present

## 2024-03-16 DIAGNOSIS — I442 Atrioventricular block, complete: Secondary | ICD-10-CM | POA: Diagnosis not present

## 2024-03-16 DIAGNOSIS — R001 Bradycardia, unspecified: Secondary | ICD-10-CM

## 2024-03-16 DIAGNOSIS — E785 Hyperlipidemia, unspecified: Secondary | ICD-10-CM | POA: Diagnosis not present

## 2024-03-16 DIAGNOSIS — I4430 Unspecified atrioventricular block: Secondary | ICD-10-CM

## 2024-03-16 NOTE — Patient Instructions (Signed)

## 2024-04-17 DIAGNOSIS — B079 Viral wart, unspecified: Secondary | ICD-10-CM | POA: Diagnosis not present

## 2024-04-17 DIAGNOSIS — L299 Pruritus, unspecified: Secondary | ICD-10-CM | POA: Diagnosis not present

## 2024-04-17 DIAGNOSIS — D485 Neoplasm of uncertain behavior of skin: Secondary | ICD-10-CM | POA: Diagnosis not present

## 2024-05-04 ENCOUNTER — Ambulatory Visit (INDEPENDENT_AMBULATORY_CARE_PROVIDER_SITE_OTHER): Payer: PPO

## 2024-05-04 DIAGNOSIS — I441 Atrioventricular block, second degree: Secondary | ICD-10-CM

## 2024-05-04 LAB — CUP PACEART REMOTE DEVICE CHECK
Battery Remaining Longevity: 115 mo
Battery Voltage: 3.02 V
Brady Statistic AP VP Percent: 10.53 %
Brady Statistic AP VS Percent: 0.1 %
Brady Statistic AS VP Percent: 89.36 %
Brady Statistic AS VS Percent: 0.02 %
Brady Statistic RA Percent Paced: 10.59 %
Brady Statistic RV Percent Paced: 99.89 %
Date Time Interrogation Session: 20250601222138
Implantable Lead Connection Status: 753985
Implantable Lead Connection Status: 753985
Implantable Lead Implant Date: 20231201
Implantable Lead Implant Date: 20231201
Implantable Lead Location: 753859
Implantable Lead Location: 753860
Implantable Lead Model: 3830
Implantable Lead Model: 5076
Implantable Pulse Generator Implant Date: 20231201
Lead Channel Impedance Value: 342 Ohm
Lead Channel Impedance Value: 399 Ohm
Lead Channel Impedance Value: 456 Ohm
Lead Channel Impedance Value: 722 Ohm
Lead Channel Pacing Threshold Amplitude: 0.625 V
Lead Channel Pacing Threshold Amplitude: 1.625 V
Lead Channel Pacing Threshold Pulse Width: 0.4 ms
Lead Channel Pacing Threshold Pulse Width: 0.4 ms
Lead Channel Sensing Intrinsic Amplitude: 13.125 mV
Lead Channel Sensing Intrinsic Amplitude: 13.125 mV
Lead Channel Sensing Intrinsic Amplitude: 3.625 mV
Lead Channel Sensing Intrinsic Amplitude: 3.625 mV
Lead Channel Setting Pacing Amplitude: 1.5 V
Lead Channel Setting Pacing Amplitude: 3.25 V
Lead Channel Setting Pacing Pulse Width: 0.4 ms
Lead Channel Setting Sensing Sensitivity: 1.2 mV
Zone Setting Status: 755011
Zone Setting Status: 755011

## 2024-05-13 ENCOUNTER — Ambulatory Visit: Payer: Self-pay | Admitting: Cardiology

## 2024-06-23 NOTE — Progress Notes (Signed)
 Remote pacemaker transmission.

## 2024-06-26 DIAGNOSIS — L820 Inflamed seborrheic keratosis: Secondary | ICD-10-CM | POA: Diagnosis not present

## 2024-06-26 DIAGNOSIS — S51812A Laceration without foreign body of left forearm, initial encounter: Secondary | ICD-10-CM | POA: Diagnosis not present

## 2024-06-26 DIAGNOSIS — L2989 Other pruritus: Secondary | ICD-10-CM | POA: Diagnosis not present

## 2024-06-26 DIAGNOSIS — Z85828 Personal history of other malignant neoplasm of skin: Secondary | ICD-10-CM | POA: Diagnosis not present

## 2024-06-26 DIAGNOSIS — Z08 Encounter for follow-up examination after completed treatment for malignant neoplasm: Secondary | ICD-10-CM | POA: Diagnosis not present

## 2024-06-26 DIAGNOSIS — L281 Prurigo nodularis: Secondary | ICD-10-CM | POA: Diagnosis not present

## 2024-06-26 DIAGNOSIS — D485 Neoplasm of uncertain behavior of skin: Secondary | ICD-10-CM | POA: Diagnosis not present

## 2024-07-30 ENCOUNTER — Encounter: Payer: PPO | Admitting: Internal Medicine

## 2024-08-04 ENCOUNTER — Encounter: Payer: Self-pay | Admitting: Internal Medicine

## 2024-08-04 ENCOUNTER — Ambulatory Visit (INDEPENDENT_AMBULATORY_CARE_PROVIDER_SITE_OTHER): Payer: PPO

## 2024-08-04 ENCOUNTER — Ambulatory Visit: Payer: PPO | Admitting: Internal Medicine

## 2024-08-04 DIAGNOSIS — F5104 Psychophysiologic insomnia: Secondary | ICD-10-CM

## 2024-08-04 DIAGNOSIS — H903 Sensorineural hearing loss, bilateral: Secondary | ICD-10-CM | POA: Diagnosis not present

## 2024-08-04 DIAGNOSIS — H9190 Unspecified hearing loss, unspecified ear: Secondary | ICD-10-CM

## 2024-08-04 DIAGNOSIS — R7303 Prediabetes: Secondary | ICD-10-CM | POA: Diagnosis not present

## 2024-08-04 DIAGNOSIS — I441 Atrioventricular block, second degree: Secondary | ICD-10-CM

## 2024-08-04 DIAGNOSIS — R7301 Impaired fasting glucose: Secondary | ICD-10-CM

## 2024-08-04 DIAGNOSIS — E785 Hyperlipidemia, unspecified: Secondary | ICD-10-CM

## 2024-08-04 DIAGNOSIS — R5383 Other fatigue: Secondary | ICD-10-CM

## 2024-08-04 DIAGNOSIS — Z0000 Encounter for general adult medical examination without abnormal findings: Secondary | ICD-10-CM

## 2024-08-04 DIAGNOSIS — M810 Age-related osteoporosis without current pathological fracture: Secondary | ICD-10-CM

## 2024-08-04 DIAGNOSIS — Z0001 Encounter for general adult medical examination with abnormal findings: Secondary | ICD-10-CM

## 2024-08-04 DIAGNOSIS — Z95811 Presence of heart assist device: Secondary | ICD-10-CM | POA: Insufficient documentation

## 2024-08-04 DIAGNOSIS — Z1231 Encounter for screening mammogram for malignant neoplasm of breast: Secondary | ICD-10-CM

## 2024-08-04 LAB — LIPID PANEL
Cholesterol: 153 mg/dL (ref 0–200)
HDL: 58.5 mg/dL (ref >39.00)
LDL Cholesterol: 76 mg/dL (ref 0–99)
NonHDL: 94.07
Total CHOL/HDL Ratio: 3
Triglycerides: 89 mg/dL (ref 0.0–149.0)
VLDL: 17.8 mg/dL (ref 0.0–40.0)

## 2024-08-04 LAB — TSH: TSH: 1.42 u[IU]/mL (ref 0.35–5.50)

## 2024-08-04 LAB — CBC WITH DIFFERENTIAL/PLATELET
Basophils Absolute: 0 K/uL (ref 0.0–0.1)
Basophils Relative: 0.9 % (ref 0.0–3.0)
Eosinophils Absolute: 0.4 K/uL (ref 0.0–0.7)
Eosinophils Relative: 8.5 % — ABNORMAL HIGH (ref 0.0–5.0)
HCT: 41.3 % (ref 36.0–46.0)
Hemoglobin: 13.7 g/dL (ref 12.0–15.0)
Lymphocytes Relative: 29.7 % (ref 12.0–46.0)
Lymphs Abs: 1.5 K/uL (ref 0.7–4.0)
MCHC: 33.2 g/dL (ref 30.0–36.0)
MCV: 92.3 fl (ref 78.0–100.0)
Monocytes Absolute: 0.8 K/uL (ref 0.1–1.0)
Monocytes Relative: 15.5 % — ABNORMAL HIGH (ref 3.0–12.0)
Neutro Abs: 2.4 K/uL (ref 1.4–7.7)
Neutrophils Relative %: 45.4 % (ref 43.0–77.0)
Platelets: 213 K/uL (ref 150.0–400.0)
RBC: 4.47 Mil/uL (ref 3.87–5.11)
RDW: 12.9 % (ref 11.5–15.5)
WBC: 5.2 K/uL (ref 4.0–10.5)

## 2024-08-04 LAB — COMPREHENSIVE METABOLIC PANEL WITH GFR
ALT: 13 U/L (ref 0–35)
AST: 19 U/L (ref 0–37)
Albumin: 4.1 g/dL (ref 3.5–5.2)
Alkaline Phosphatase: 76 U/L (ref 39–117)
BUN: 22 mg/dL (ref 6–23)
CO2: 29 meq/L (ref 19–32)
Calcium: 8.9 mg/dL (ref 8.4–10.5)
Chloride: 102 meq/L (ref 96–112)
Creatinine, Ser: 0.72 mg/dL (ref 0.40–1.20)
GFR: 81.05 mL/min (ref >60.00)
Glucose, Bld: 86 mg/dL (ref 70–99)
Potassium: 4.1 meq/L (ref 3.5–5.1)
Sodium: 139 meq/L (ref 135–145)
Total Bilirubin: 0.5 mg/dL (ref 0.2–1.2)
Total Protein: 6.8 g/dL (ref 6.0–8.3)

## 2024-08-04 LAB — LDL CHOLESTEROL, DIRECT: Direct LDL: 75 mg/dL

## 2024-08-04 LAB — HEMOGLOBIN A1C: Hgb A1c MFr Bld: 6.3 % (ref 4.6–6.5)

## 2024-08-04 MED ORDER — ROSUVASTATIN CALCIUM 10 MG PO TABS: 10.0000 mg | ORAL_TABLET | ORAL | 1 refills | Status: AC

## 2024-08-04 NOTE — Patient Instructions (Addendum)
 I recommend taking 1000 international units of Vit D 3 daily  And 600 mg calcium  daily  (Algae Cal) to supple,ment your dietary   )  Referral to Dr Herminio for a hearing test has been made   YOUR MAMMOGRAM Ihas been ordered , PLEASE CALL AND GET THIS SCHEDULED.   [facility] - call [phone]  Nicole Nixon does  the scheduling for [facility] as well

## 2024-08-04 NOTE — Progress Notes (Unsigned)
 Patient ID: Nicole Nixon, female    DOB: December 14, 1945  Age: 78 y.o. MRN: [id number]  The patient is here for annual preventive examination and management of other chronic and acute problems.   The risk factors are reflected in the social history.   The roster of all physicians providing medical care to patient - is listed in the Snapshot section of the chart.   Activities of daily living:  The patient is 100% independent in all ADLs: dressing, toileting, feeding as well as independent mobility   Home safety : The patient has smoke detectors in the home. They wear seatbelts.  There are no unsecured firearms at home. There is no violence in the home.    There is no risks for hepatitis, STDs or HIV. There is no   history of blood transfusion. They have no travel history to infectious disease endemic areas of the world.   The patient has seen their dentist in the last six month. They have seen their eye doctor in the last year. The patinet  denies slight hearing difficulty with regard to whispered voices and some television programs.  They have deferred audiologic testing in the last year.  They do not  have excessive sun exposure. Discussed the need for sun protection: hats, long sleeves and use of sunscreen if there is significant sun exposure.    Diet: the importance of a healthy diet is discussed. They do have a healthy diet.   The benefits of regular aerobic exercise were discussed. The patient  has resumed exercising 5 days per week  for  60 minuts and does yardwork     Depression screen: there are no signs or vegative symptoms of depression- irritability, change in appetite, anhedonia, sadness/tearfullness.   The following portions of the patient's history were reviewed and updated as appropriate: allergies, current medications, past family history, past medical history,  past surgical history, past social history  and problem list.   Visual acuity was not assessed per patient  preference since the patient has regular follow up with an  ophthalmologist. Hearing and body mass index were assessed and reviewed.    During the course of the visit the patient was educated and counseled about appropriate screening and preventive services including : fall prevention , diabetes screening, nutrition counseling, colorectal cancer screening, and recommended immunizations.    Chief Complaint:    Stress of husband's GI intestinal issues:  getting a second opinion at [facility] Nicole Nixon)  Chronic insomnia: both initiating and returning to sleep after voiding .   Review of Symptoms  Patient denies headache, fevers, malaise, unintentional weight loss, skin rash, eye pain, sinus congestion and sinus pain, sore throat, dysphagia,  hemoptysis , cough, dyspnea, wheezing, chest pain, palpitations, orthopnea, edema, abdominal pain, nausea, melena, diarrhea, constipation, flank pain, dysuria, hematuria, urinary  Frequency, nocturia, numbness, tingling, seizures,  Focal weakness, Loss of consciousness,  Tremor, insomnia, depression, anxiety, and suicidal ideation.    Physical Exam:  There were no vitals taken for this visit.   Physical Exam Vitals reviewed.  Constitutional:      General: She is not in acute distress.    Appearance: Normal appearance. She is well-developed and normal weight. She is not ill-appearing, toxic-appearing or diaphoretic.  HENT:     Head: Normocephalic.     Right Ear: Tympanic membrane, ear canal and external ear normal. There is no impacted cerumen.     Left Ear: Tympanic membrane, ear canal and external ear normal. There is  no impacted cerumen.     Nose: Nose normal.     Mouth/Throat:     Mouth: Mucous membranes are moist.     Pharynx: Oropharynx is clear.  Eyes:     General: No scleral icterus.       Right eye: No discharge.        Left eye: No discharge.     Conjunctiva/sclera: Conjunctivae normal.     Pupils: Pupils are equal, round, and reactive to  light.  Neck:     Thyroid : No thyromegaly.     Vascular: No carotid bruit or JVD.  Cardiovascular:     Rate and Rhythm: Normal rate and regular rhythm.     Heart sounds: Normal heart sounds.  Pulmonary:     Effort: Pulmonary effort is normal. No respiratory distress.     Breath sounds: Normal breath sounds.  Chest:  Breasts:    Breasts are symmetrical.     Right: Normal. No swelling, inverted nipple, mass, nipple discharge, skin change or tenderness.     Left: Normal. No swelling, inverted nipple, mass, nipple discharge, skin change or tenderness.  Abdominal:     General: Bowel sounds are normal.     Palpations: Abdomen is soft. There is no mass.     Tenderness: There is no abdominal tenderness. There is no guarding or rebound.  Musculoskeletal:        General: Normal range of motion.     Cervical back: Normal range of motion and neck supple.  Lymphadenopathy:     Cervical: No cervical adenopathy.     Upper Body:     Right upper body: No supraclavicular, axillary or pectoral adenopathy.     Left upper body: No supraclavicular, axillary or pectoral adenopathy.  Skin:    General: Skin is warm and dry.  Neurological:     General: No focal deficit present.     Mental Status: She is alert and oriented to person, place, and time. Mental status is at baseline.  Psychiatric:        Mood and Affect: Mood normal.        Behavior: Behavior normal.        Thought Content: Thought content normal.        Judgment: Judgment normal.     Assessment and Plan: Encounter for preventive health examination Assessment & Plan: age appropriate education and counseling updated, referrals for preventative services and immunizations addressed, dietary and smoking counseling addressed, most recent labs reviewed.  I have personally reviewed and have noted:   1) the patient's medical and social history 2) The pt's use of alcohol, tobacco, and illicit drugs 3) The patient's current medications and  supplements 4) Functional ability including ADL's, fall risk, home safety risk, hearing and visual impairment 5) Diet and physical activities 6) Evidence for depression or mood disorder 7) The patient's height, weight, and BMI have been recorded in the chartsinus rhythm.     I have made referrals, and provided counseling and education based on review of the above    Hyperlipidemia LDL goal <70 Assessment & Plan: Statin therapy was started due to presence of aortic atherosclerosis and she is close to goal    Lab Results  Component Value Date   CHOL 153 08/04/2024   HDL 58.50 08/04/2024   LDLCALC 76 08/04/2024   LDLDIRECT 75.0 08/04/2024   TRIG 89.0 08/04/2024   CHOLHDL 3 08/04/2024   Lab Results  Component Value Date   CKTOTAL 46 01/31/2024  Lab Results  Component Value Date   ALT 13 08/04/2024   AST 19 08/04/2024   ALKPHOS 76 08/04/2024   BILITOT 0.5 08/04/2024     Orders: -     Lipid panel -     LDL cholesterol, direct  Fatigue, unspecified type -     CBC with Differential/Platelet -     TSH  Impaired fasting glucose -     Comprehensive metabolic panel with GFR -     Hemoglobin A1c  Encounter for preventative adult health care examination  Encounter for screening mammogram for malignant neoplasm of breast -     3D Screening Mammogram, Left and Right; Future  Presence of heart assist device (HCC)  Hard of hearing -     Ambulatory referral to Audiology  Chronic insomnia Assessment & Plan: Averaging 6 to 6.5 hours  Per night ,  No daytime somnolence.  No anxiety. She declines pharmacotherapy   Sensorineural hearing loss (SNHL) of both ears Assessment & Plan: Suggested by today's whisper test.  Referring to audiology   Osteoporosis, post-menopausal Assessment & Plan: She continues to defer resumption treatment wit Prolia or Evista to halt the progression of bone loss.  She has had a referral to  endocrinology who agreed with  rec to start EVista..    she has had Prior intolerance to forteo  prior to use of Alendronate. reviewed options and last DEXA today    Prediabetes Assessment & Plan: Her  random glucose is not  elevated but her A1c suggests she is at risk for developing diabetes.  I recommend he follow a low glycemic index diet and particpate regularly in an aerobic  exercise activity.  We should check an A1c in 6 months.    Orders: -     Hemoglobin A1c; Future -     Comprehensive metabolic panel with GFR; Future  Other orders -     Rosuvastatin  Calcium ; Take 1 tablet (10 mg total) by mouth every other day.  Dispense: 45 tablet; Refill: 1    No follow-ups on file.  Verneita LITTIE Kettering, MD

## 2024-08-05 LAB — CUP PACEART REMOTE DEVICE CHECK
Battery Remaining Longevity: 120 mo
Battery Voltage: 3.01 V
Brady Statistic AP VP Percent: 11.56 %
Brady Statistic AP VS Percent: 0 %
Brady Statistic AS VP Percent: 88.43 %
Brady Statistic AS VS Percent: 0.01 %
Brady Statistic RA Percent Paced: 11.53 %
Brady Statistic RV Percent Paced: 99.99 %
Date Time Interrogation Session: 20250901220143
Implantable Lead Connection Status: 753985
Implantable Lead Connection Status: 753985
Implantable Lead Implant Date: 20231201
Implantable Lead Implant Date: 20231201
Implantable Lead Location: 753859
Implantable Lead Location: 753860
Implantable Lead Model: 3830
Implantable Lead Model: 5076
Implantable Pulse Generator Implant Date: 20231201
Lead Channel Impedance Value: 323 Ohm
Lead Channel Impedance Value: 399 Ohm
Lead Channel Impedance Value: 418 Ohm
Lead Channel Impedance Value: 741 Ohm
Lead Channel Pacing Threshold Amplitude: 0.5 V
Lead Channel Pacing Threshold Amplitude: 1.375 V
Lead Channel Pacing Threshold Pulse Width: 0.4 ms
Lead Channel Pacing Threshold Pulse Width: 0.4 ms
Lead Channel Sensing Intrinsic Amplitude: 13.125 mV
Lead Channel Sensing Intrinsic Amplitude: 13.125 mV
Lead Channel Sensing Intrinsic Amplitude: 3.5 mV
Lead Channel Sensing Intrinsic Amplitude: 3.5 mV
Lead Channel Setting Pacing Amplitude: 1.5 V
Lead Channel Setting Pacing Amplitude: 2.75 V
Lead Channel Setting Pacing Pulse Width: 0.4 ms
Lead Channel Setting Sensing Sensitivity: 1.2 mV
Zone Setting Status: 755011
Zone Setting Status: 755011

## 2024-08-06 ENCOUNTER — Ambulatory Visit: Payer: Self-pay | Admitting: Internal Medicine

## 2024-08-06 DIAGNOSIS — R7303 Prediabetes: Secondary | ICD-10-CM | POA: Insufficient documentation

## 2024-08-06 NOTE — Assessment & Plan Note (Signed)
Averaging 6 to 6.5 hours  Per night ,  No daytime somnolence.  No anxiety. She declines pharmacotherapy

## 2024-08-06 NOTE — Addendum Note (Signed)
 Addended by: MARYLYNN VERNEITA CROME on: 08/06/2024 05:22 PM   Modules accepted: Orders, Level of Service

## 2024-08-06 NOTE — Assessment & Plan Note (Signed)
 Statin therapy was started due to presence of aortic atherosclerosis and she is close to goal    Lab Results  Component Value Date   CHOL 153 08/04/2024   HDL 58.50 08/04/2024   LDLCALC 76 08/04/2024   LDLDIRECT 75.0 08/04/2024   TRIG 89.0 08/04/2024   CHOLHDL 3 08/04/2024   Lab Results  Component Value Date   CKTOTAL 46 01/31/2024   Lab Results  Component Value Date   ALT 13 08/04/2024   AST 19 08/04/2024   ALKPHOS 76 08/04/2024   BILITOT 0.5 08/04/2024

## 2024-08-06 NOTE — Assessment & Plan Note (Signed)
Her  random glucose is not  elevated but her A1c suggests she is at risk for developing diabetes.  I recommend he follow a low glycemic index diet and particpate regularly in an aerobic  exercise activity.  We should check an A1c in 6 months.   

## 2024-08-06 NOTE — Assessment & Plan Note (Signed)
Suggested by today's whisper test.  Referring to audiology

## 2024-08-06 NOTE — Assessment & Plan Note (Signed)

## 2024-08-06 NOTE — Assessment & Plan Note (Signed)
 She continues to defer resumption treatment wit Prolia or Evista to halt the progression of bone loss.  She has had a referral to  endocrinology who agreed with  rec to start EVista..   she has had Prior intolerance to forteo  prior to use of Alendronate. reviewed options and last DEXA today

## 2024-08-07 ENCOUNTER — Ambulatory Visit: Payer: Self-pay | Admitting: Cardiology

## 2024-08-11 NOTE — Progress Notes (Signed)
 Remote PPM Transmission

## 2024-08-19 DIAGNOSIS — H401132 Primary open-angle glaucoma, bilateral, moderate stage: Secondary | ICD-10-CM | POA: Diagnosis not present

## 2024-09-28 ENCOUNTER — Encounter: Payer: Self-pay | Admitting: Internal Medicine

## 2024-09-29 NOTE — Telephone Encounter (Signed)
 Lm for patient to call office and ask for Darice.

## 2024-10-13 ENCOUNTER — Encounter: Payer: Self-pay | Admitting: Internal Medicine

## 2024-10-13 DIAGNOSIS — M791 Myalgia, unspecified site: Secondary | ICD-10-CM

## 2024-10-19 DIAGNOSIS — M791 Myalgia, unspecified site: Secondary | ICD-10-CM | POA: Insufficient documentation

## 2024-10-19 MED ORDER — EZETIMIBE 10 MG PO TABS: 10.0000 mg | ORAL_TABLET | Freq: Every day | ORAL | 0 refills | Status: AC

## 2024-10-19 NOTE — Assessment & Plan Note (Signed)
 Stopping statin,  starting Zetia

## 2024-11-02 ENCOUNTER — Ambulatory Visit
Admission: RE | Admit: 2024-11-02 | Discharge: 2024-11-02 | Disposition: A | Discharge status: Home / Self Care | Source: Ambulatory Visit | Attending: Internal Medicine | Admitting: Internal Medicine

## 2024-11-02 DIAGNOSIS — Z1231 Encounter for screening mammogram for malignant neoplasm of breast: Secondary | ICD-10-CM | POA: Insufficient documentation

## 2024-11-03 ENCOUNTER — Ambulatory Visit: Payer: PPO

## 2024-11-03 DIAGNOSIS — I491 Atrial premature depolarization: Secondary | ICD-10-CM | POA: Diagnosis not present

## 2024-11-04 LAB — CUP PACEART REMOTE DEVICE CHECK
Battery Remaining Longevity: 112 mo
Battery Voltage: 3.01 V
Brady Statistic AP VP Percent: 10.73 %
Brady Statistic AP VS Percent: 0 %
Brady Statistic AS VP Percent: 89.26 %
Brady Statistic AS VS Percent: 0.01 %
Brady Statistic RA Percent Paced: 10.7 %
Brady Statistic RV Percent Paced: 99.99 %
Date Time Interrogation Session: 20251201200413
Implantable Lead Connection Status: 753985
Implantable Lead Connection Status: 753985
Implantable Lead Implant Date: 20231201
Implantable Lead Implant Date: 20231201
Implantable Lead Location: 753859
Implantable Lead Location: 753860
Implantable Lead Model: 3830
Implantable Lead Model: 5076
Implantable Pulse Generator Implant Date: 20231201
Lead Channel Impedance Value: 342 Ohm
Lead Channel Impedance Value: 380 Ohm
Lead Channel Impedance Value: 437 Ohm
Lead Channel Impedance Value: 703 Ohm
Lead Channel Pacing Threshold Amplitude: 0.5 V
Lead Channel Pacing Threshold Amplitude: 1.25 V
Lead Channel Pacing Threshold Pulse Width: 0.4 ms
Lead Channel Pacing Threshold Pulse Width: 0.4 ms
Lead Channel Sensing Intrinsic Amplitude: 13.125 mV
Lead Channel Sensing Intrinsic Amplitude: 13.125 mV
Lead Channel Sensing Intrinsic Amplitude: 2.375 mV
Lead Channel Sensing Intrinsic Amplitude: 2.375 mV
Lead Channel Setting Pacing Amplitude: 1.5 V
Lead Channel Setting Pacing Amplitude: 3 V
Lead Channel Setting Pacing Pulse Width: 0.4 ms
Lead Channel Setting Sensing Sensitivity: 1.2 mV
Zone Setting Status: 755011
Zone Setting Status: 755011

## 2024-11-06 NOTE — Progress Notes (Signed)
 Remote PPM Transmission

## 2024-11-11 ENCOUNTER — Ambulatory Visit: Payer: Self-pay | Admitting: Cardiology

## 2025-03-31 ENCOUNTER — Ambulatory Visit: Admitting: Cardiology

## 2025-08-11 ENCOUNTER — Encounter: Admitting: Internal Medicine
